# Patient Record
Sex: Male | Born: 1960
Health system: Southern US, Community
[De-identification: ages and names within clinical notes are randomized; demographics above are authoritative.]

## PROBLEM LIST (undated history)

## (undated) DIAGNOSIS — E119 Type 2 diabetes mellitus without complications: Secondary | ICD-10-CM

## (undated) DIAGNOSIS — T7840XA Allergy, unspecified, initial encounter: Secondary | ICD-10-CM

## (undated) HISTORY — DX: Allergy, unspecified, initial encounter: T78.40XA

---

## 2003-05-23 ENCOUNTER — Emergency Department (HOSPITAL_COMMUNITY): Admission: EM | Admit: 2003-05-23 | Discharge: 2003-05-23 | Payer: Self-pay | Admitting: Emergency Medicine

## 2007-01-11 ENCOUNTER — Emergency Department (HOSPITAL_COMMUNITY): Admission: EM | Admit: 2007-01-11 | Discharge: 2007-01-11 | Payer: Self-pay | Admitting: Emergency Medicine

## 2009-10-02 ENCOUNTER — Encounter: Admission: RE | Admit: 2009-10-02 | Discharge: 2009-10-02 | Payer: Self-pay | Admitting: General Practice

## 2010-12-07 ENCOUNTER — Emergency Department (HOSPITAL_COMMUNITY)
Admission: EM | Admit: 2010-12-07 | Discharge: 2010-12-07 | Payer: Self-pay | Source: Home / Self Care | Admitting: Emergency Medicine

## 2011-03-12 LAB — URINALYSIS, ROUTINE W REFLEX MICROSCOPIC
Bilirubin Urine: NEGATIVE
Nitrite: NEGATIVE
Specific Gravity, Urine: 1.01 (ref 1.005–1.030)
Urobilinogen, UA: 0.2 mg/dL (ref 0.0–1.0)
pH: 6 (ref 5.0–8.0)

## 2013-11-04 ENCOUNTER — Ambulatory Visit: Payer: Self-pay | Admitting: Physician Assistant

## 2013-11-04 VITALS — BP 126/82 | HR 90 | Temp 98.0°F | Resp 16 | Ht 64.0 in | Wt 168.8 lb

## 2013-11-04 DIAGNOSIS — Z0289 Encounter for other administrative examinations: Secondary | ICD-10-CM

## 2013-11-04 NOTE — Progress Notes (Signed)
   [address] [place] [address]   Phone [phone]   Subjective:    Patient ID: Anthony Walker, male    DOB: [birth year], 52 y.o.   MRN: [id number]  HPI Pt presents to clinic for DOT exam.  He has had them in the past and always gotten a 2 year card.  He is healthy and has no concerns.  Review of Systems  Constitutional: Negative.   HENT: Negative.   Eyes: Negative.   Respiratory: Negative.   Cardiovascular: Negative.   Gastrointestinal: Negative.   Endocrine: Negative.   Genitourinary: Negative.   Musculoskeletal: Negative.   Allergic/Immunologic: Negative.   Neurological: Negative.   Hematological: Negative.        Objective:   Physical Exam  Vitals reviewed. Constitutional: He is oriented to person, place, and time. He appears well-developed and well-nourished.  HENT:  Head: Normocephalic and atraumatic.  Right Ear: Hearing, tympanic membrane, external ear and ear canal normal.  Left Ear: Hearing, tympanic membrane, external ear and ear canal normal.  Nose: Nose normal.  Mouth/Throat: Uvula is midline, oropharynx is clear and moist and mucous membranes are normal.  Eyes: Conjunctivae and EOM are normal. Pupils are equal, round, and reactive to light.  Neck: Normal range of motion. Neck supple.  Cardiovascular: Normal rate, regular rhythm and normal heart sounds.   No murmur heard. Pulmonary/Chest: Effort normal and breath sounds normal.  Abdominal: Soft. Bowel sounds are normal. There is no tenderness. Hernia confirmed negative in the right inguinal area and confirmed negative in the left inguinal area.  Genitourinary: Circumcised.  Musculoskeletal: Normal range of motion.  Lymphadenopathy:    He has no cervical adenopathy.  Neurological: He is alert and oriented to person, place, and time. He has normal reflexes.  Skin: Skin is warm and dry.  Psychiatric: He has a normal mood and affect. His behavior is normal. Judgment and thought content normal.      Assessment & Plan:  Health examination of defined subpopulation  2 year DOT card.  Benny Lennert PA-C 11/04/2013 5:48 PM

## 2015-11-05 ENCOUNTER — Ambulatory Visit (INDEPENDENT_AMBULATORY_CARE_PROVIDER_SITE_OTHER): Payer: Self-pay | Admitting: Family Medicine

## 2015-11-05 VITALS — BP 120/80 | HR 79 | Temp 97.8°F | Resp 18 | Ht 64.0 in | Wt 166.0 lb

## 2015-11-05 DIAGNOSIS — Z0000 Encounter for general adult medical examination without abnormal findings: Secondary | ICD-10-CM

## 2015-11-05 DIAGNOSIS — Z021 Encounter for pre-employment examination: Secondary | ICD-10-CM

## 2015-11-05 NOTE — Progress Notes (Signed)
 @  This chart was scribed for Anthony Sidle, MD by Andrew Au, ED Scribe. This patient was seen in room 3 and the patient's care was started at 3:10 PM.  Patient ID: Anthony Walker MRN: [id number], DOB: 1961/05/30 54 y.o. Date of Encounter: 11/05/2015, 3:15 PM  Primary Physician: No primary care provider on file.  Chief Complaint: DOT Physical  HPI: 54 y.o. y/o male with history noted below here for DOT physical. 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   Doing well. Does not take medications.  No issues/complaints.  Review of Systems: Consitutional: No fever, chills, fatigue, night sweats, lymphadenopathy, or weight changes. Eyes: No visual changes, eye redness, or discharge. ENT/Mouth: Ears: No otalgia, tinnitus, hearing loss, discharge. Nose: No congestion, rhinorrhea, sinus pain, or epistaxis. Throat: No sore throat, post nasal drip, or teeth pain. Cardiovascular: No CP, palpitations, diaphoresis, DOE, edema, orthopnea, PND. Respiratory: No cough, hemoptysis, SOB, or wheezing. Gastrointestinal: No anorexia, dysphagia, reflux, pain, nausea, vomiting, hematemesis, diarrhea, constipation, BRBPR, or melena. Genitourinary: No dysuria, frequency, urgency, hematuria, incontinence, nocturia, decreased urinary stream, discharge, impotence, or testicular pain/masses. Musculoskeletal: No decreased ROM, myalgias, stiffness, joint swelling, or weakness. Skin: No rash, erythema, lesion changes, pain, warmth, jaundice, or pruritis. Neurological: No headache, dizziness, syncope, seizures, tremors, memory loss, coordination problems, or paresthesias. Psychological: No anxiety, depression, hallucinations, SI/HI. Endocrine: No fatigue, polydipsia, polyphagia, polyuria, or known diabetes. All other systems were reviewed and are otherwise negative.  Past Medical History  Diagnosis Date  . Seasonal      No past surgical history on file.  Home Meds:  Prior to  Admission medications   Medication Sig Start Date End Date Taking? Authorizing Provider  cetirizine (ZYRTEC) 10 MG tablet Take 10 mg by mouth daily.    Historical Provider, MD    Allergies: No Known Allergies  Social History   Social History  . Marital Status: Single    Spouse Name: N/A  . Number of Children: N/A  . Years of Education: N/A   Occupational History  . Not on file.   Social History Main Topics  . Smoking status: Never Smoker   . Smokeless tobacco: Not on file  . Alcohol Use: No  . Drug Use: No  . Sexual Activity: Not on file   Other Topics Concern  . Not on file   Social History Narrative    Family History  Problem Relation Age of Onset  . Hypertension Mother   . Diabetes Father   . Parkinson's disease Father     Physical Exam: Blood pressure 120/80, pulse 79, temperature 97.8 F (36.6 C), temperature source Oral, resp. rate 18, height  (1.626 m), weight 166 lb (75.297 kg), SpO2 99 %.  BP Readings from Last 3 Encounters:  11/05/15 120/80  11/04/13 126/82   General: Well developed, well nourished, in no acute distress. HEENT: Normocephalic, atraumatic. Conjunctiva pink, sclera non-icteric. Pupils 2 mm constricting to 1 mm, round, regular, and equally reactive to light and accomodation. EOMI.  Fundi benign   Internal auditory canal clear. TMs with good cone of light and without pathology. Nasal mucosa pink. Nares are without discharge. No sinus tenderness. Oral mucosa pink. Dentition. Pharynx without exudate.    Neck: Supple. Trachea midline. No thyromegaly. Full ROM. No lymphadenopathy. Lungs: Clear to auscultation bilaterally without wheezes, rales, or rhonchi. Breathing is of normal effort and unlabored. Cardiovascular: RRR with S1 S2. No murmurs, rubs, or gallops appreciated. Distal pulses 2+ symmetrically. No  carotid or abdominal bruits Abdomen: Soft, non-tender, non-distended with normoactive bowel sounds. No hepatosplenomegaly or masses. No  rebound/guarding. No CVA tenderness. Without hernias.  Rectal: No external hemorrhoids or fissures. Rectal vault without masses.  Genitourinary:  circumcised male. No penile lesions. Testes descended bilaterally, and smooth without tenderness or masses. No hernia Musculoskeletal: Full range of motion and 5/5 strength throughout. Without swelling, atrophy, tenderness, crepitus, or warmth. Extremities without clubbing, cyanosis, or edema. Calves supple. Skin: Warm and moist without erythema, ecchymosis, wounds, or rash. Neuro: A+Ox3. CN II-XII grossly intact. Moves all extremities spontaneously. Full sensation throughout. Normal gait. DTR 2+ throughout upper and lower extremities. Finger to nose intact. Psych:  Responds to questions appropriately with a normal affect.   Assessment/Plan:  54 y.o. y/o healthy male here for CPE   ICD-9-CM ICD-10-CM   1. Annual physical exam V70.0 Z00.00       By signing my name below, I, [name], attest that this documentation has been prepared under the direction and in the presence of Anthony SidleKurt Babita Amaker, MD.  Electronically Signed: Andrew Auaven Small, ED Scribe. 11/05/2015. 3:15 PM.   Signed, Anthony SidleKurt Anguel Delapena, MD 11/05/2015 3:15 PM

## 2017-10-20 ENCOUNTER — Ambulatory Visit: Payer: Self-pay | Admitting: Family Medicine

## 2017-10-21 ENCOUNTER — Encounter: Payer: Self-pay | Admitting: Family Medicine

## 2017-10-21 ENCOUNTER — Ambulatory Visit (INDEPENDENT_AMBULATORY_CARE_PROVIDER_SITE_OTHER): Payer: Self-pay | Admitting: Family Medicine

## 2017-10-21 VITALS — BP 102/68 | HR 83 | Temp 98.0°F | Resp 16 | Ht 64.96 in | Wt 170.0 lb

## 2017-10-21 DIAGNOSIS — Z024 Encounter for examination for driving license: Secondary | ICD-10-CM

## 2017-10-21 NOTE — Patient Instructions (Addendum)
I do not see any concerns on your physical today. 2 year card was provided.    IF you received an x-ray today, you will receive an invoice from [facility]. Please contact [facility] at [phone] with questions or concerns regarding your invoice.   IF you received labwork today, you will receive an invoice from [facility]. Please contact [facility] at [phone] with questions or concerns regarding your invoice.   Our billing staff will not be able to assist you with questions regarding bills from these companies.  You will be contacted with the lab results as soon as they are available. The fastest way to get your results is to activate your My Chart account. Instructions are located on the last page of this paperwork. If you have not heard from us regarding the results in 2 weeks, please contact this office.

## 2017-10-21 NOTE — Progress Notes (Signed)
Subjective:  By signing my name below, I, Ivar Bury, attest that this documentation has been prepared under the direction and in the presence of Dr. Meredith Staggers, MD. Electronically Signed: Ivar Bury, Medical Scribe 10/21/2017 at 4:08 PM.   Patient ID: Anthony Walker, male    DOB: 1961-12-22, 56 y.o.   MRN: [id number]  Chief Complaint  Patient presents with  . DOT Phy    HPI Anthony Walker is a 56 y.o. male who presents to [facility] Care at [facility] for DOT physical. 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 No medications or concerns at that time. He was provided a 2-year card. Today he reports no changes in medical or surgical history. He is not taking any prescribed medications. He reports occasional snoring but has never been told he has sleep apnea.   There are no active problems to display for this patient.  Past Medical History:  Diagnosis Date  . Seasonal    History reviewed. No pertinent surgical history. No Known Allergies Prior to Admission medications   Medication Sig Start Date End Date Taking? Authorizing Provider  cetirizine (ZYRTEC) 10 MG tablet Take 10 mg by mouth daily.   Yes [provider]   Social History   Social History  . Marital status: Single    Spouse name: N/A  . Number of children: N/A  . Years of education: N/A   Occupational History  . Not on file.   Social History Main Topics  . Smoking status: Never Smoker  . Smokeless tobacco: Never Used  . Alcohol use No  . Drug use: No  . Sexual activity: Not on file   Other Topics Concern  . Not on file   Social History Narrative  . No narrative on file    Review of Systems Negative other than above and reviewed driver form. No positive responses on driver history form.     Objective:   Physical Exam  Constitutional: He is oriented to person, place, and time. He appears well-developed and well-nourished.  HENT:  Head: Normocephalic and atraumatic.    Right Ear: External ear normal.  Left Ear: External ear normal.  Mouth/Throat: Oropharynx is clear and moist.  Eyes: Pupils are equal, round, and reactive to light. Conjunctivae and EOM are normal.  Neck: Normal range of motion. Neck supple. No thyromegaly present.  Cardiovascular: Normal rate, regular rhythm, normal heart sounds and intact distal pulses.   Pulmonary/Chest: Effort normal and breath sounds normal. No respiratory distress. He has no wheezes.  Abdominal: Soft. He exhibits no distension. There is no tenderness.  Musculoskeletal: Normal range of motion. He exhibits no edema or tenderness.  Lymphadenopathy:    He has no cervical adenopathy.  Neurological: He is alert and oriented to person, place, and time. He has normal reflexes.  Skin: Skin is warm and dry.  Psychiatric: He has a normal mood and affect. His behavior is normal.  Vitals reviewed.   Vitals:   10/21/17 1522  BP: 102/68  Pulse: 83  Resp: 16  Temp: 98 F (36.7 C)  TempSrc: Oral  SpO2: 98%  Weight: 170 lb (77.1 kg)  Height: 5' 4.96" (1.65 m)    Visual Acuity Screening   Right eye Left eye Both eyes  Without correction: 20/20 20/20 20/20   With correction:     Comments: Horizontal:   Left-85 Right-70   Hearing Screening Comments: Whisper Test:  Left- 25ft Right-82ft  Results for orders placed or performed during the  hospital encounter of 12/07/10  Urinalysis, Routine w reflex microscopic  Result Value Ref Range   Color, Urine YELLOW YELLOW   APPearance CLOUDY (A) CLEAR   Specific Gravity, Urine 1.010 1.005 - 1.030   pH 6.0 5.0 - 8.0   Glucose, UA NEGATIVE NEGATIVE mg/dL   Hgb urine dipstick NEGATIVE NEGATIVE   Bilirubin Urine NEGATIVE NEGATIVE   Ketones, ur NEGATIVE NEGATIVE mg/dL   Protein, ur NEGATIVE NEGATIVE mg/dL   Urobilinogen, UA 0.2 0.0 - 1.0 mg/dL   Nitrite NEGATIVE NEGATIVE   Leukocytes, UA  NEGATIVE    NEGATIVE MICROSCOPIC NOT DONE ON URINES WITH NEGATIVE PROTEIN, BLOOD,  LEUKOCYTES, NITRITE, OR GLUCOSE <1000 mg/dL.      Assessment & Plan:  Anthony FailHayatuddeen A Walker is a 56 y.o. male Encounter for commercial driver medical examination (CDME)  - DOT physical, no concerning findings on exam or history. 2 year card provided, see paperwork.  No orders of the defined types were placed in this encounter.  Patient Instructions    I do not see any concerns on your physical today. 2 year card was provided.    IF you received an x-ray today, you will receive an invoice from [facility]. Please contact [facility] at [phone] with questions or concerns regarding your invoice.   IF you received labwork today, you will receive an invoice from [facility]. Please contact [facility] at [phone] with questions or concerns regarding your invoice.   Our billing staff will not be able to assist you with questions regarding bills from these companies.  You will be contacted with the lab results as soon as they are available. The fastest way to get your results is to activate your My Chart account. Instructions are located on the last page of this paperwork. If you have not heard from us regarding the results in 2 weeks, please contact this office.       I personally performed the services described in this documentation, which was scribed in my presence. The recorded information has been reviewed and considered for accuracy and completeness, addended by me as needed, and agree with information above.  Signed,   Meredith StaggersJeffrey Finnbar Cedillos, MD [facility] at [facility].  10/22/17 3:04 PM

## 2018-04-30 ENCOUNTER — Emergency Department (HOSPITAL_COMMUNITY): Payer: BLUE CROSS/BLUE SHIELD

## 2018-04-30 ENCOUNTER — Emergency Department (HOSPITAL_COMMUNITY)
Admission: EM | Admit: 2018-04-30 | Discharge: 2018-05-01 | Disposition: A | Discharge status: Home / Self Care | Payer: BLUE CROSS/BLUE SHIELD | Attending: Emergency Medicine | Admitting: Emergency Medicine

## 2018-04-30 ENCOUNTER — Other Ambulatory Visit: Payer: Self-pay

## 2018-04-30 ENCOUNTER — Encounter (HOSPITAL_COMMUNITY): Payer: Self-pay | Admitting: Emergency Medicine

## 2018-04-30 DIAGNOSIS — Y999 Unspecified external cause status: Secondary | ICD-10-CM | POA: Insufficient documentation

## 2018-04-30 DIAGNOSIS — Y929 Unspecified place or not applicable: Secondary | ICD-10-CM | POA: Insufficient documentation

## 2018-04-30 DIAGNOSIS — S61216A Laceration without foreign body of right little finger without damage to nail, initial encounter: Secondary | ICD-10-CM | POA: Diagnosis present

## 2018-04-30 DIAGNOSIS — Z79899 Other long term (current) drug therapy: Secondary | ICD-10-CM | POA: Insufficient documentation

## 2018-04-30 DIAGNOSIS — W268XXA Contact with other sharp object(s), not elsewhere classified, initial encounter: Secondary | ICD-10-CM | POA: Diagnosis not present

## 2018-04-30 DIAGNOSIS — Y9366 Activity, soccer: Secondary | ICD-10-CM | POA: Insufficient documentation

## 2018-04-30 MED ORDER — LIDOCAINE HCL (PF) 1 % IJ SOLN
10.0000 mL | Freq: Once | INTRAMUSCULAR | Status: AC
  Administered 2018-04-30: 10 mL
  Filled 2018-04-30: qty 10

## 2018-04-30 MED ORDER — BUPIVACAINE HCL 0.25 % IJ SOLN
5.0000 mL | Freq: Once | INTRAMUSCULAR | Status: DC
  Filled 2018-04-30: qty 5

## 2018-04-30 MED ORDER — TETANUS-DIPHTH-ACELL PERTUSSIS 5-2.5-18.5 LF-MCG/0.5 IM SUSP
0.5000 mL | Freq: Once | INTRAMUSCULAR | Status: AC
  Administered 2018-04-30: 0.5 mL via INTRAMUSCULAR
  Filled 2018-04-30: qty 0.5

## 2018-04-30 NOTE — ED Triage Notes (Signed)
Pt presents with lac to R pinky approx 1in, states he cut finger on metal fence while playing soccer. Bleeding controlled. Tetanus not UTD

## 2018-05-01 MED ORDER — LIDOCAINE HCL (PF) 1 % IJ SOLN
5.0000 mL | Freq: Once | INTRAMUSCULAR | Status: AC
  Filled 2018-05-01: qty 5

## 2018-05-01 MED ORDER — IBUPROFEN 600 MG PO TABS: 600.0000 mg | ORAL_TABLET | Freq: Four times a day (QID) | ORAL | 0 refills | Status: DC | PRN

## 2018-05-01 MED ORDER — ACETAMINOPHEN 325 MG PO TABS: 650.0000 mg | ORAL_TABLET | Freq: Four times a day (QID) | ORAL | 0 refills | Status: DC | PRN

## 2018-05-01 MED ORDER — CEPHALEXIN 250 MG PO CAPS
500.0000 mg | ORAL_CAPSULE | Freq: Once | ORAL | Status: AC
  Administered 2018-05-01: 500 mg via ORAL
  Filled 2018-05-01: qty 2

## 2018-05-01 MED ORDER — CEPHALEXIN 500 MG PO CAPS: 500.0000 mg | ORAL_CAPSULE | Freq: Four times a day (QID) | ORAL | 0 refills | Status: DC

## 2018-05-01 NOTE — ED Notes (Signed)
ED Provider at bedside. 

## 2018-05-01 NOTE — Discharge Instructions (Signed)
Please see the information and instructions below regarding your visit.  Your diagnoses today include:  1. Laceration of right little finger without foreign body without damage to nail, initial encounter     Tests performed today include: X-ray of the affected area that did not show any foreign bodies or broken bones Vital signs. See below for your results today.   Medications prescribed:   Please take all of your antibiotics until finished.   You may develop abdominal discomfort or nausea from the antibiotic. If this occurs, you may take it with food. Some patients also get diarrhea with antibiotics. You may help offset this with probiotics which you can buy or get in yogurt. Do not eat or take the probiotics until 2 hours after your antibiotic. Some women develop vaginal yeast infections after antibiotics. If you develop unusual vaginal discharge after being on this medication, please see your primary care provider.   Some people develop allergies to antibiotics. Symptoms of antibiotic allergy can be mild and include a flat rash and itching. They can also be more serious and include:  ?Hives - Hives are raised, red patches of skin that are usually very itchy.  ?Lip or tongue swelling  ?Trouble swallowing or breathing  ?Blistering of the skin or mouth.  If you have any of these serious symptoms, please seek emergency medical care immediately.  Take any prescribed medications only as directed.  Ibuprofen alternating with Tylenol for pain.   You are prescribed ibuprofen, a non-steroidal anti-inflammatory agent (NSAID) for pain. You may take  every 6 hours as needed for pain. If still requiring this medication around the clock for acute pain after 10 days, please see your primary healthcare provider.  Women who are pregnant, breastfeeding, or planning on becoming pregnant should not take non-steroidal anti-inflammatories such as   You may combine this medication with Tylenol, 650  mg every 6 hours, so you are receiving something for pain every 3 hours.  This is not a long-term medication unless under the care and direction of your primary provider. Taking this medication long-term and not under the supervision of a healthcare provider could increase the risk of stomach ulcers, kidney problems, and cardiovascular problems such as high blood pressure.   Home care instructions:  Follow any educational materials and wound care instructions contained in this packet.   Keep affected area above the level of your heart when possible to minimize swelling. Wash area gently twice a day with warm soapy water. Do not apply alcohol or hydrogen peroxide directly over a wound. Cover the area if it is draining or weeping. Keep the bandage in place for 24 hours and refrain from getting the wound wet for 24 hours. After that, you may get the area wet, but please ensure that you dry it completely afterwards.  Please refrain from soaking sutures for long periods of time, or swimming in chlorinated water   Follow-up instructions: Suture Removal: Return to the Emergency Department or see your primary care care doctor in 10 days for a recheck of your wound and removal of your sutures or staples.    Return instructions:  Return to the Emergency Department if you have: Fever Worsening pain Worsening swelling of the wound Pus draining from the wound Redness of the skin that moves away from the wound, especially if it streaks away from the affected area  Any other emergent concerns  Your vital signs today were: BP 123/82 (BP Location: Right Arm)    Pulse 89  Temp 98.4 F (36.9 C) (Oral)    Resp 18    Ht  (1.626 m)    Wt 77.1 kg (170 lb)    SpO2 100%    BMI 29.18 kg/m  If your blood pressure (BP) was elevated on multiple readings during this visit above 130 for the top number or above 80 for the bottom number, please have this repeated by your primary care provider within one  month. --------------  Thank you for allowing [place] to participate in your care today! It was a pleasure taking care of you.

## 2018-05-01 NOTE — ED Notes (Signed)
Pt departed in NAD, refused use of wheelchair.  

## 2018-05-01 NOTE — ED Provider Notes (Addendum)
[facility] EMERGENCY DEPARTMENT Provider Note   CSN: [id number] Arrival date & time: 04/30/18  2106     History   Chief Complaint Chief Complaint  Patient presents with  . Laceration    HPI Anthony Walker is a 57 y.o. male.  HPI  Patient is a 57 year old male with a history of seasonal allergies presenting for laceration to the right fifth digit on the flexor surface.  Patient reports he was playing soccer earlier this evening, when the ball florescence, when he reached over to grab it, he caught his hand on the edge of the fence and sustained laceration.  Patient reports that bleeding was controlled prior to arrival with compression.  Patient denies any weakness or numbness, or inability to flex or extend the fifth finger of right hand.  Patient reports that he does not know when his last tetanus shot was.  Past Medical History:  Diagnosis Date  . Seasonal     There are no active problems to display for this patient.   History reviewed. No pertinent surgical history.      Home Medications    Prior to Admission medications   Medication Sig Start Date End Date Taking? Authorizing Provider  cetirizine (ZYRTEC) 10 MG tablet Take 10 mg by mouth daily.    [provider]    Family History Family History  Problem Relation Age of Onset  . Hypertension Mother   . Diabetes Father   . Parkinson's disease Father     Social History Social History   Tobacco Use  . Smoking status: Never Smoker  . Smokeless tobacco: Never Used  Substance Use Topics  . Alcohol use: No  . Drug use: No     Allergies   Patient has no known allergies.   Review of Systems Review of Systems  Skin: Positive for wound.  Neurological: Negative for weakness and numbness.     Physical Exam Updated Vital Signs BP 123/82 (BP Location: Right Arm)   Pulse 89   Temp 98.4 F (36.9 C) (Oral)   Resp 18   Ht  (1.626 m)   Wt 77.1 kg (170 lb)   SpO2  100%   BMI 29.18 kg/m   Physical Exam  Constitutional: He appears well-developed and well-nourished. No distress.  Sitting comfortably in bed.  HENT:  Head: Normocephalic and atraumatic.  Eyes: Conjunctivae are normal. Right eye exhibits no discharge. Left eye exhibits no discharge.  EOMs normal to gross examination.  Neck: Normal range of motion.  Cardiovascular: Normal rate and regular rhythm.  Intact, 2+ radial and ulnar pulse of RUE.  Pulmonary/Chest:  Normal respiratory effort. Patient converses comfortably. No audible wheeze or stridor.  Abdominal: He exhibits no distension.  Musculoskeletal: Normal range of motion.  Hand Exam:  Inspection: There is approximately 1.5 cm diagonal laceration extending across the flexor surface of the right fifth digit crossing over the PIP joint.   Palpation: Wound explored once anesthetized, and demonstrates no evidence of flexor tendon exposure or injury. ROM: Passive/active ROM intact at wrist, MCP, PIP, and DIP joints, thumb MCP and IP joints, and no rotational deformity of metacarpals noted. Ligamentous stability: No laxity to valgus/varus stress of MCP, PIP, or DIP joints. No joint laxity with radial stress of thumb. Flexor/Extensor tendons: FDS/FDP tendons intact in digits 2-5 at PIP/DIP joints, respectively; extensor tendons intact in all digits Nerve testing:  Sensation is intact to sharp touch in entire digital nerve distribution of the right fifth  digit.  Vascular: 2+ radial and ulnar pulses. Capillary refill <2 seconds b/l.  Neurological: He is alert.  Cranial nerves intact to gross observation. Patient moves extremities without difficulty.  Skin: Skin is warm and dry. He is not diaphoretic.  Psychiatric: He has a normal mood and affect. His behavior is normal. Judgment and thought content normal.  Nursing note and vitals reviewed.      ED Treatments / Results  Labs (all labs ordered are listed, but only abnormal results are  displayed) Labs Reviewed - No data to display  EKG None  Radiology Dg Finger Little Right  Result Date: 04/30/2018 CLINICAL DATA:  Laceration EXAM: RIGHT LITTLE FINGER 2+V COMPARISON:  None. FINDINGS: No fracture or malalignment. No radiopaque foreign body. Soft tissue injury volar aspect of the mid fifth digit. IMPRESSION: No acute osseous abnormality Electronically Signed   By: Jasmine Pang M.D.   On: 04/30/2018 21:47    Procedures .Marland KitchenLaceration Repair Date/Time: 05/01/2018 2:51 AM Performed by: Elisha Ponder, PA-C Authorized by: Elisha Ponder, PA-C   Consent:    Consent obtained:  Verbal   Consent given by:  Patient   Risks discussed:  Infection, pain and poor wound healing Anesthesia (see MAR for exact dosages):    Anesthesia method:  Nerve block   Block needle gauge:  25 G   Block anesthetic:  Lidocaine 1% w/o epi   Block technique:  Digital block   Block outcome:  Anesthesia achieved Laceration details:    Location:  Finger   Finger location:  R small finger   Length (cm):  1.5 Repair type:    Repair type:  Simple Pre-procedure details:    Preparation:  Patient was prepped and draped in usual sterile fashion and imaging obtained to evaluate for foreign bodies Exploration:    Hemostasis achieved with:  Direct pressure   Wound exploration: wound explored through full range of motion     Wound extent: no nerve damage noted, no tendon damage noted, no underlying fracture noted and no vascular damage noted     Contaminated: no   Treatment:    Area cleansed with:  Betadine   Amount of cleaning:  Standard   Irrigation solution:  Sterile saline (1000 ml)   Irrigation method:  Syringe and pressure wash   Visualized foreign bodies/material removed: yes   Skin repair:    Repair method:  Sutures   Suture size:  4-0   Suture material:  Nylon   Suture technique:  Simple interrupted   Number of sutures:  3 Approximation:    Approximation:  Close Post-procedure  details:    Dressing:  Non-adherent dressing, splint for protection and tube gauze   Patient tolerance of procedure:  Tolerated well, no immediate complications   (including critical care time)  Medications Ordered in ED Medications  bupivacaine (MARCAINE) 0.25 % (with pres) injection 5 mL (has no administration in time range)  Tdap (BOOSTRIX) injection 0.5 mL (0.5 mLs Intramuscular Given 04/30/18 2324)  lidocaine (PF) (XYLOCAINE) 1 % injection 10 mL (10 mLs Infiltration Given 04/30/18 2347)  lidocaine (PF) (XYLOCAINE) 1 % injection 5 mL (5 mLs Infiltration Given 05/01/18 0033)     Initial Impression / Assessment and Plan / ED Course  I have reviewed the triage vital signs and the nursing notes.  Pertinent labs & imaging results that were available during my care of the patient were reviewed by me and considered in my medical decision making (see chart for details).  Patient is well-appearing and neurovascularly intact in the right hand.  There is no evidence of nervous injury or tendon exposure or injury.  Patient is able to perform all motions of flexion and extension of the right fifth digit without difficulty.  The wound was extensively irrigated with 1000 mL of normal saline.  Loose primary closure successfully performed.  Tdap updated today.  Patient also given Keflex prophylaxis for infection.  Patient was given return precautions for any increasing swelling, redness from the site, purulent drainage, pallor or paresthesias of the finger, or delayed capillary refill.  Patient is in understanding and agrees with the plan of care.  Laceration image show to Dr. Maurine Simmering, who is in agreement with plan of care.  Final Clinical Impressions(s) / ED Diagnoses   Final diagnoses:  Laceration of right little finger without foreign body without damage to nail, initial encounter    ED Discharge Orders        Ordered    cephALEXin (KEFLEX) 500 MG capsule  4 times daily     05/01/18  0156    ibuprofen (ADVIL,MOTRIN) 600 MG tablet  Every 6 hours PRN     05/01/18 0157    acetaminophen (TYLENOL) 325 MG tablet  Every 6 hours PRN     05/01/18 0157       Elisha Ponder, PA-C 05/01/18 0254    Aviva Kluver B, PA-C 05/01/18 0256    Zadie Rhine, MD 05/02/18 [phone]

## 2018-05-12 ENCOUNTER — Encounter (HOSPITAL_COMMUNITY): Payer: Self-pay | Admitting: Emergency Medicine

## 2018-05-12 ENCOUNTER — Emergency Department (HOSPITAL_COMMUNITY)
Admission: EM | Admit: 2018-05-12 | Discharge: 2018-05-12 | Disposition: A | Discharge status: Home / Self Care | Payer: BLUE CROSS/BLUE SHIELD | Attending: Emergency Medicine | Admitting: Emergency Medicine

## 2018-05-12 ENCOUNTER — Other Ambulatory Visit: Payer: Self-pay

## 2018-05-12 DIAGNOSIS — Z4802 Encounter for removal of sutures: Secondary | ICD-10-CM

## 2018-05-12 DIAGNOSIS — Z79899 Other long term (current) drug therapy: Secondary | ICD-10-CM | POA: Diagnosis not present

## 2018-05-12 DIAGNOSIS — S6992XD Unspecified injury of left wrist, hand and finger(s), subsequent encounter: Secondary | ICD-10-CM | POA: Diagnosis present

## 2018-05-12 DIAGNOSIS — S61217D Laceration without foreign body of left little finger without damage to nail, subsequent encounter: Secondary | ICD-10-CM | POA: Insufficient documentation

## 2018-05-12 DIAGNOSIS — W228XXD Striking against or struck by other objects, subsequent encounter: Secondary | ICD-10-CM | POA: Insufficient documentation

## 2018-05-12 NOTE — ED Triage Notes (Signed)
Patient here for suture removal in right hand. Sutures placed 12 days ago. No redness, warmth, or drainage from wound.

## 2018-05-12 NOTE — ED Provider Notes (Signed)
[facility] EMERGENCY DEPARTMENT Provider Note   CSN: [id number] Arrival date & time: 05/12/18  1118     History   Chief Complaint Chief Complaint  Patient presents with  . Suture / Staple Removal    HPI Anthony Walker is a 57 y.o. male.  HPI   57 year old male presents today with laceration to his left fifth digit.  Patient suffered a laceration proximally 12 days ago was treated and discharged home.  He notes good wound healing with no signs of infection no complications.  Past Medical History:  Diagnosis Date  . Seasonal     There are no active problems to display for this patient.   History reviewed. No pertinent surgical history.      Home Medications    Prior to Admission medications   Medication Sig Start Date End Date Taking? Authorizing Provider  acetaminophen (TYLENOL) 325 MG tablet Take 2 tablets (650 mg total) by mouth every 6 (six) hours as needed. 05/01/18   Aviva Kluver B, PA-C  cephALEXin (KEFLEX) 500 MG capsule Take 1 capsule (500 mg total) by mouth 4 (four) times daily. 05/01/18   Aviva Kluver B, PA-C  cetirizine (ZYRTEC) 10 MG tablet Take 10 mg by mouth daily.    [provider]  ibuprofen (ADVIL,MOTRIN) 600 MG tablet Take 1 tablet (600 mg total) by mouth every 6 (six) hours as needed. 05/01/18   Elisha Ponder, PA-C    Family History Family History  Problem Relation Age of Onset  . Hypertension Mother   . Diabetes Father   . Parkinson's disease Father     Social History Social History   Tobacco Use  . Smoking status: Never Smoker  . Smokeless tobacco: Never Used  Substance Use Topics  . Alcohol use: No  . Drug use: No     Allergies   Patient has no known allergies.   Review of Systems Review of Systems  All other systems reviewed and are negative.    Physical Exam Updated Vital Signs BP 115/81 (BP Location: Right Arm)   Pulse 70   Temp 98.2 F (36.8 C) (Oral)   Resp 20   SpO2 100%    Physical Exam  Constitutional: He is oriented to person, place, and time. He appears well-developed and well-nourished.  HENT:  Head: Normocephalic and atraumatic.  Eyes: Pupils are equal, round, and reactive to light. Conjunctivae are normal. Right eye exhibits no discharge. Left eye exhibits no discharge. No scleral icterus.  Neck: Normal range of motion. No JVD present. No tracheal deviation present.  Pulmonary/Chest: Effort normal. No stridor.  Musculoskeletal:  1.5 cm well approximated wound to the left fifth digit along the palmar aspect, no surrounding redness, tenderness swelling or discharge  Neurological: He is alert and oriented to person, place, and time. Coordination normal.  Psychiatric: He has a normal mood and affect. His behavior is normal. Judgment and thought content normal.  Nursing note and vitals reviewed.    ED Treatments / Results  Labs (all labs ordered are listed, but only abnormal results are displayed) Labs Reviewed - No data to display  EKG None  Radiology No results found.  Procedures .Suture Removal Date/Time: 05/12/2018 2:22 PM Performed by: Eyvonne Mechanic, PA-C Authorized by: Eyvonne Mechanic, PA-C   Consent:    Consent obtained:  Verbal   Consent given by:  Patient Location:    Location: Left fifth finger. Procedure details:    Wound appearance:  No signs of infection,  good wound healing and clean   Number of sutures removed:  3 Post-procedure details:    Post-removal:  No dressing applied   Patient tolerance of procedure:  Tolerated well, no immediate complications   (including critical care time)  Medications Ordered in ED Medications - No data to display   Initial Impression / Assessment and Plan / ED Course  I have reviewed the triage vital signs and the nursing notes.  Pertinent labs & imaging results that were available during my care of the patient were reviewed by me and considered in my medical decision making (see  chart for details).     Suture removal no signs of infection return precautions given. Final Clinical Impressions(s) / ED Diagnoses   Final diagnoses:  Visit for suture removal    ED Discharge Orders    None       Rosalio Loud 05/12/18 1423    Rolland Porter, MD 05/18/18 [phone]

## 2018-05-12 NOTE — Discharge Instructions (Addendum)
Please read attached information. If you experience any new or worsening signs or symptoms please return to the emergency room for evaluation. Please follow-up with your primary care provider or specialist as discussed.  °

## 2018-10-06 ENCOUNTER — Encounter (HOSPITAL_COMMUNITY): Payer: Self-pay

## 2018-10-06 ENCOUNTER — Emergency Department (HOSPITAL_COMMUNITY)
Admission: EM | Admit: 2018-10-06 | Discharge: 2018-10-06 | Disposition: A | Discharge status: Home / Self Care | Payer: BLUE CROSS/BLUE SHIELD | Attending: Emergency Medicine | Admitting: Emergency Medicine

## 2018-10-06 ENCOUNTER — Other Ambulatory Visit: Payer: Self-pay

## 2018-10-06 DIAGNOSIS — E162 Hypoglycemia, unspecified: Secondary | ICD-10-CM | POA: Insufficient documentation

## 2018-10-06 DIAGNOSIS — R42 Dizziness and giddiness: Secondary | ICD-10-CM | POA: Diagnosis present

## 2018-10-06 DIAGNOSIS — Z7984 Long term (current) use of oral hypoglycemic drugs: Secondary | ICD-10-CM | POA: Diagnosis not present

## 2018-10-06 DIAGNOSIS — E119 Type 2 diabetes mellitus without complications: Secondary | ICD-10-CM | POA: Diagnosis not present

## 2018-10-06 HISTORY — DX: Type 2 diabetes mellitus without complications: E11.9

## 2018-10-06 LAB — COMPREHENSIVE METABOLIC PANEL
ALBUMIN: 4.6 g/dL (ref 3.5–5.0)
ALT: 17 U/L (ref 0–44)
ANION GAP: 12 (ref 5–15)
AST: 21 U/L (ref 15–41)
Alkaline Phosphatase: 108 U/L (ref 38–126)
BUN: 13 mg/dL (ref 6–20)
CHLORIDE: 104 mmol/L (ref 98–111)
CO2: 25 mmol/L (ref 22–32)
Calcium: 9.8 mg/dL (ref 8.9–10.3)
Creatinine, Ser: 1.11 mg/dL (ref 0.61–1.24)
GFR calc Af Amer: >60 mL/min (ref >60)
GFR calc non Af Amer: >60 mL/min (ref >60)
GLUCOSE: 121 mg/dL — AB (ref 70–99)
Potassium: 4.3 mmol/L (ref 3.5–5.1)
SODIUM: 141 mmol/L (ref 135–145)
TOTAL PROTEIN: 8.4 g/dL — AB (ref 6.5–8.1)
Total Bilirubin: 0.7 mg/dL (ref 0.3–1.2)

## 2018-10-06 LAB — CBC WITH DIFFERENTIAL/PLATELET
Abs Immature Granulocytes: 0.01 10*3/uL (ref 0.00–0.07)
Basophils Absolute: 0 10*3/uL (ref 0.0–0.1)
Basophils Relative: 1 %
EOS ABS: 0.1 10*3/uL (ref 0.0–0.5)
EOS PCT: 3 %
HCT: 41.6 % (ref 39.0–52.0)
Hemoglobin: 14 g/dL (ref 13.0–17.0)
IMMATURE GRANULOCYTES: 0 %
LYMPHS PCT: 42 %
Lymphs Abs: 1.9 10*3/uL (ref 0.7–4.0)
MCH: 30.3 pg (ref 26.0–34.0)
MCHC: 33.7 g/dL (ref 30.0–36.0)
MCV: 90 fL (ref 80.0–100.0)
Monocytes Absolute: 0.6 10*3/uL (ref 0.1–1.0)
Monocytes Relative: 12 %
NEUTROS PCT: 42 %
NRBC: 0 % (ref 0.0–0.2)
Neutro Abs: 1.9 10*3/uL (ref 1.7–7.7)
Platelets: 263 10*3/uL (ref 150–400)
RBC: 4.62 MIL/uL (ref 4.22–5.81)
RDW: 12.8 % (ref 11.5–15.5)
WBC: 4.4 10*3/uL (ref 4.0–10.5)

## 2018-10-06 LAB — CBG MONITORING, ED: Glucose-Capillary: 94 mg/dL (ref 70–99)

## 2018-10-06 MED ORDER — ACETAMINOPHEN 325 MG PO TABS
650.0000 mg | ORAL_TABLET | Freq: Once | ORAL | Status: AC
  Administered 2018-10-06: 650 mg via ORAL
  Filled 2018-10-06: qty 2

## 2018-10-06 NOTE — ED Provider Notes (Signed)
[facility]-EMERGENCY DEPT Provider Note   CSN: [id number] Arrival date & time: 10/06/18  1749     History   Chief Complaint Chief Complaint  Patient presents with  . Hypoglycemia  . Dizziness  . Headache    HPI Anthony Walker is a 57 y.o. male.  Patient states that he had a headache and he felt weak.  He checked his sugar and it was low.  Patient has been taking Glucophage but has not been eating much  The history is provided by the patient. No language interpreter was used.  Illness  This is a new problem. The current episode started 3 to 5 hours ago. The problem occurs constantly. The problem has not changed since onset.Associated symptoms include headaches. Pertinent negatives include no chest pain and no abdominal pain. Nothing aggravates the symptoms. Nothing relieves the symptoms. He has tried nothing for the symptoms. The treatment provided no relief.    Past Medical History:  Diagnosis Date  . Diabetes mellitus without complication (HCC)   . Seasonal     There are no active problems to display for this patient.   History reviewed. No pertinent surgical history.      Home Medications    Prior to Admission medications   Medication Sig Start Date End Date Taking? Authorizing Provider  metFORMIN (GLUCOPHAGE) 500 MG tablet Take 500 mg by mouth daily with breakfast.   Yes [provider]  acetaminophen (TYLENOL) 325 MG tablet Take 2 tablets (650 mg total) by mouth every 6 (six) hours as needed. Patient not taking: Reported on 10/06/2018 05/01/18   Aviva Kluver B, PA-C  cephALEXin (KEFLEX) 500 MG capsule Take 1 capsule (500 mg total) by mouth 4 (four) times daily. Patient not taking: Reported on 10/06/2018 05/01/18   Aviva Kluver B, PA-C  ibuprofen (ADVIL,MOTRIN) 600 MG tablet Take 1 tablet (600 mg total) by mouth every 6 (six) hours as needed. Patient not taking: Reported on 10/06/2018 05/01/18   Elisha Ponder, PA-C    Family  History Family History  Problem Relation Age of Onset  . Hypertension Mother   . Diabetes Father   . Parkinson's disease Father     Social History Social History   Tobacco Use  . Smoking status: Never Smoker  . Smokeless tobacco: Never Used  Substance Use Topics  . Alcohol use: No  . Drug use: No     Allergies   Patient has no known allergies.   Review of Systems Review of Systems  Constitutional: Negative for appetite change and fatigue.  HENT: Negative for congestion, ear discharge and sinus pressure.   Eyes: Negative for discharge.  Respiratory: Negative for cough.   Cardiovascular: Negative for chest pain.  Gastrointestinal: Negative for abdominal pain and diarrhea.  Genitourinary: Negative for frequency and hematuria.  Musculoskeletal: Negative for back pain.  Skin: Negative for rash.  Neurological: Positive for headaches. Negative for seizures.  Psychiatric/Behavioral: Negative for hallucinations.     Physical Exam Updated Vital Signs BP 133/82   Pulse 71   Temp 97.9 F (36.6 C) (Oral)   Resp 16   Ht 5\' 5"  (1.651 m)   Wt 74.8 kg   SpO2 100%   BMI 27.46 kg/m   Physical Exam  Constitutional: He is oriented to person, place, and time. He appears well-developed.  HENT:  Head: Normocephalic.  Eyes: Conjunctivae and EOM are normal. No scleral icterus.  Neck: Neck supple. No thyromegaly present.  Cardiovascular: Normal rate and regular  rhythm. Exam reveals no gallop and no friction rub.  No murmur heard. Pulmonary/Chest: No stridor. He has no wheezes. He has no rales. He exhibits no tenderness.  Abdominal: He exhibits no distension. There is no tenderness. There is no rebound.  Musculoskeletal: Normal range of motion. He exhibits no edema.  Lymphadenopathy:    He has no cervical adenopathy.  Neurological: He is oriented to person, place, and time. He exhibits normal muscle tone. Coordination normal.  Skin: No rash noted. No erythema.  Psychiatric: He  has a normal mood and affect. His behavior is normal.     ED Treatments / Results  Labs (all labs ordered are listed, but only abnormal results are displayed) Labs Reviewed  COMPREHENSIVE METABOLIC PANEL - Abnormal; Notable for the following components:      Result Value   Glucose, Bld 121 (*)    Total Protein 8.4 (*)    All other components within normal limits  CBC WITH DIFFERENTIAL/PLATELET  CBG MONITORING, ED    EKG EKG Interpretation  Date/Time:  Tuesday October 06 2018 17:58:14 EDT Ventricular Rate:  75 PR Interval:    QRS Duration: 94 QT Interval:  370 QTC Calculation: 414 R Axis:   87 Text Interpretation:  Sinus rhythm Minimal ST elevation, inferior leads Confirmed by Bethann Berkshire [phone]) on 10/06/2018 10:55:16 PM   Radiology No results found.  Procedures Procedures (including critical care time)  Medications Ordered in ED Medications  acetaminophen (TYLENOL) tablet 650 mg (650 mg Oral Given 10/06/18 2115)     Initial Impression / Assessment and Plan / ED Course  I have reviewed the triage vital signs and the nursing notes.  Pertinent labs & imaging results that were available during my care of the patient were reviewed by me and considered in my medical decision making (see chart for details).     Patient improved with some Tylenol and eating some food.  His glucose was 120 here.  He was told to make sure he ate 3 meals a day and to stop taking his Glucophage right now.  He will check his sugar daily and see his PCP next week  Final Clinical Impressions(s) / ED Diagnoses   Final diagnoses:  Hypoglycemia    ED Discharge Orders    None       Bethann Berkshire, MD 10/06/18 2258

## 2018-10-06 NOTE — ED Notes (Signed)
Pt given a sandwich

## 2018-10-06 NOTE — Discharge Instructions (Addendum)
Make sure you eat 3 meals a day.  Check your sugar at least once a day.  Stop taking your diabetes medicine and follow-up with your PCP next week

## 2018-10-06 NOTE — ED Notes (Addendum)
Pt stated he had 2 cookies while waiting in the lobby. Also, he hasn't taken any of his diabetes medication today. He couldn't remember the name of it.

## 2018-10-06 NOTE — ED Triage Notes (Addendum)
Patient states he thought his blood sugar ws low because he was not feeling good. Patient states that he is currently out of his strips and was unable to check. Patient states after eating salad today, he became dizzy and states he was a little dizzy prior to coming to the ED. Patient states he has not eaten what he normally does. Patient c/o headache today.

## 2018-10-09 LAB — CBG MONITORING, ED: Glucose-Capillary: 88 mg/dL (ref 70–99)

## 2019-09-07 ENCOUNTER — Emergency Department (HOSPITAL_COMMUNITY)
Admission: EM | Admit: 2019-09-07 | Discharge: 2019-09-07 | Disposition: A | Discharge status: Home / Self Care | Payer: BLUE CROSS/BLUE SHIELD | Attending: Emergency Medicine | Admitting: Emergency Medicine

## 2019-09-07 ENCOUNTER — Other Ambulatory Visit: Payer: Self-pay

## 2019-09-07 ENCOUNTER — Encounter (HOSPITAL_COMMUNITY): Payer: Self-pay

## 2019-09-07 ENCOUNTER — Emergency Department (HOSPITAL_COMMUNITY): Payer: BLUE CROSS/BLUE SHIELD

## 2019-09-07 DIAGNOSIS — Z79899 Other long term (current) drug therapy: Secondary | ICD-10-CM | POA: Insufficient documentation

## 2019-09-07 DIAGNOSIS — E119 Type 2 diabetes mellitus without complications: Secondary | ICD-10-CM | POA: Diagnosis not present

## 2019-09-07 DIAGNOSIS — R079 Chest pain, unspecified: Secondary | ICD-10-CM | POA: Insufficient documentation

## 2019-09-07 LAB — BASIC METABOLIC PANEL
Anion gap: 8 (ref 5–15)
BUN: 14 mg/dL (ref 6–20)
CO2: 26 mmol/L (ref 22–32)
Calcium: 9.4 mg/dL (ref 8.9–10.3)
Chloride: 104 mmol/L (ref 98–111)
Creatinine, Ser: 1.24 mg/dL (ref 0.61–1.24)
GFR calc Af Amer: >60 mL/min (ref >60)
GFR calc non Af Amer: >60 mL/min (ref >60)
Glucose, Bld: 139 mg/dL — ABNORMAL HIGH (ref 70–99)
Potassium: 3.9 mmol/L (ref 3.5–5.1)
Sodium: 138 mmol/L (ref 135–145)

## 2019-09-07 LAB — CBC
HCT: 43.9 % (ref 39.0–52.0)
Hemoglobin: 14.7 g/dL (ref 13.0–17.0)
MCH: 30.8 pg (ref 26.0–34.0)
MCHC: 33.5 g/dL (ref 30.0–36.0)
MCV: 92 fL (ref 80.0–100.0)
Platelets: 279 10*3/uL (ref 150–400)
RBC: 4.77 MIL/uL (ref 4.22–5.81)
RDW: 12.8 % (ref 11.5–15.5)
WBC: 3.5 10*3/uL — ABNORMAL LOW (ref 4.0–10.5)
nRBC: 0 % (ref 0.0–0.2)

## 2019-09-07 LAB — TROPONIN I (HIGH SENSITIVITY)
Troponin I (High Sensitivity): <2 ng/L (ref <18)
Troponin I (High Sensitivity): <2 ng/L (ref <18)

## 2019-09-07 MED ORDER — SODIUM CHLORIDE 0.9% FLUSH: 3.0000 mL | Freq: Once | INTRAVENOUS | Status: DC

## 2019-09-07 NOTE — ED Provider Notes (Signed)
[facility]-EMERGENCY DEPT Provider Note   CSN: [id number] Arrival date & time: 09/07/19  1701     History   Chief Complaint Chief Complaint  Patient presents with  . Chest Pain    HPI Anthony Walker is a 58 y.o. male.     The history is provided by the patient and medical records. No language interpreter was used.  Chest Pain  Anthony Walker is a 58 y.o. male  with a PMH of DM who presents to the Emergency Department complaining of intermittent chest pains which have been ongoing for last 3 or 4 weeks.  Pain is bilateral across the entire chest wall.  He was seen at urgent care who started patient on omeprazole.  He took this for a week as was recommended and started to feel better.  After the week ended, he discontinued use and chest pain did seem to become worse and return.  He started taking this as needed, typically only 2 or 3 times a week.  He feels as if it helps.  He denies any associated shortness of breath.  He actually plays soccer regularly and runs a good bit while playing soccer without any exacerbation of his chest pain during this physical activity.  Denies any diaphoresis, nausea or vomiting.  No bowel habitus changes.  No fevers.  No cough or congestion.  No lower extremity swelling or pain.  No long travel/surgery/immobilizations recently.   Past Medical History:  Diagnosis Date  . Diabetes mellitus without complication (HCC)   . Seasonal     There are no active problems to display for this patient.   No past surgical history on file.      Home Medications    Prior to Admission medications   Medication Sig Start Date End Date Taking? Authorizing Provider  omeprazole (PRILOSEC) 20 MG capsule Take 20 mg by mouth daily as needed (heartburn).  07/14/19  Yes [provider]  acetaminophen (TYLENOL) 325 MG tablet Take 2 tablets (650 mg total) by mouth every 6 (six) hours as needed. Patient not taking: Reported on  10/06/2018 05/01/18   Aviva Kluver B, PA-C  cephALEXin (KEFLEX) 500 MG capsule Take 1 capsule (500 mg total) by mouth 4 (four) times daily. Patient not taking: Reported on 10/06/2018 05/01/18   Aviva Kluver B, PA-C  ibuprofen (ADVIL,MOTRIN) 600 MG tablet Take 1 tablet (600 mg total) by mouth every 6 (six) hours as needed. Patient not taking: Reported on 10/06/2018 05/01/18   Elisha Ponder, PA-C    Family History Family History  Problem Relation Age of Onset  . Hypertension Mother   . Diabetes Father   . Parkinson's disease Father     Social History Social History   Tobacco Use  . Smoking status: Never Smoker  . Smokeless tobacco: Never Used  Substance Use Topics  . Alcohol use: No  . Drug use: No     Allergies   Patient has no known allergies.   Review of Systems Review of Systems  Cardiovascular: Positive for chest pain.  All other systems reviewed and are negative.    Physical Exam Updated Vital Signs BP 120/84   Pulse 67   Temp 98.1 F (36.7 C) (Oral)   Resp 16   Ht 5\' 5"  (1.651 m)   Wt 78.9 kg   SpO2 100%   BMI 28.96 kg/m   Physical Exam Vitals signs and nursing note reviewed.  Constitutional:      General: He  is not in acute distress.    Appearance: He is well-developed.  HENT:     Head: Normocephalic and atraumatic.  Neck:     Musculoskeletal: Neck supple.  Cardiovascular:     Rate and Rhythm: Normal rate and regular rhythm.     Heart sounds: Normal heart sounds. No murmur.  Pulmonary:     Effort: Pulmonary effort is normal. No respiratory distress.     Breath sounds: Normal breath sounds.  Abdominal:     General: There is no distension.     Palpations: Abdomen is soft.     Tenderness: There is no abdominal tenderness.  Musculoskeletal:     Right lower leg: He exhibits no tenderness. No edema.     Left lower leg: He exhibits no tenderness. No edema.  Skin:    General: Skin is warm and dry.  Neurological:     Mental Status: He is alert  and oriented to person, place, and time.      ED Treatments / Results  Labs (all labs ordered are listed, but only abnormal results are displayed) Labs Reviewed  BASIC METABOLIC PANEL - Abnormal; Notable for the following components:      Result Value   Glucose, Bld 139 (*)    All other components within normal limits  CBC - Abnormal; Notable for the following components:   WBC 3.5 (*)    All other components within normal limits  TROPONIN I (HIGH SENSITIVITY)  TROPONIN I (HIGH SENSITIVITY)    EKG EKG Interpretation  Date/Time:  Tuesday September 07 2019 17:11:13 EDT Ventricular Rate:  75 PR Interval:    QRS Duration: 90 QT Interval:  370 QTC Calculation: 414 R Axis:   92 Text Interpretation:  Sinus rhythm Borderline right axis deviation Minimal ST elevation, inferior leads Confirmed by Geoffery LyonseLo, Douglas ([id number]) on 09/07/2019 5:13:54 PM   Radiology Dg Chest 2 View  Result Date: 09/07/2019 CLINICAL DATA:  58 year old male with chest pain. EXAM: CHEST - 2 VIEW COMPARISON:  Chest radiograph dated 10/02/2009 FINDINGS: The lungs are clear. There is no pleural effusion or pneumothorax. The cardiac silhouette is within normal limits. No acute osseous pathology. Mild atherosclerotic calcification of the aortic arch. IMPRESSION: No active cardiopulmonary disease. Electronically Signed   By: Elgie CollardArash  Radparvar M.D.   On: 09/07/2019 17:44    Procedures Procedures (including critical care time)  Medications Ordered in ED Medications - No data to display   Initial Impression / Assessment and Plan / ED Course  I have reviewed the triage vital signs and the nursing notes.  Pertinent labs & imaging results that were available during my care of the patient were reviewed by me and considered in my medical decision making (see chart for details).       Anthony Walker is a 58 y.o. male who presents to ED for intermittent chest pain for several weeks. On exam, patient is well-appearing,  hemodynamically stable with normal cardiopulmonary exam. Chest pain free currently.    Labs reviewed and reassuring with negative troponin x2.  CXR with no acute abnormalities.  EKG without acute ischemic changes.   Low risk heart score of 3. Low risk well's. Doubt PE  Patient's symptoms unlikely to be of cardiac etiology. Labs and imaging reviewed again prior to discharge. Patient has been advised to return to the ED if development of any exertional chest pain, trouble breathing, new/worsening symptoms or for any additional concerns. Evaluation does not show pathology that would require ongoing emergent intervention  or inpatient treatment. Encouraged to follow up with PCP. Referral information included in discharge paperwork. Patient understands return precautions and follow up plan. All questions answered.  Final Clinical Impressions(s) / ED Diagnoses   Final diagnoses:  Chest pain with low risk for cardiac etiology    ED Discharge Orders    None       Chapman Matteucci, Ozella Almond, PA-C 09/07/19 2049    Veryl Speak, MD 09/07/19 2235

## 2019-09-07 NOTE — Discharge Instructions (Addendum)
It was my pleasure taking care of you today!  Continue taking omeprazole daily.  Call the clinic listed to schedule a follow-up appointment.  Return to ER for new or worsening symptoms, any additional concerns.

## 2019-09-07 NOTE — ED Triage Notes (Signed)
Patient c/o chest pain x 2 weeks. Patient states that he went to an UC and was prescribed Omeprazole. Patient states this medicine helped some, but returns. Patient states he get SOB at times.

## 2020-09-01 ENCOUNTER — Other Ambulatory Visit: Payer: Self-pay

## 2020-09-01 ENCOUNTER — Other Ambulatory Visit: Payer: BLUE CROSS/BLUE SHIELD

## 2020-09-01 DIAGNOSIS — Z20822 Contact with and (suspected) exposure to covid-19: Secondary | ICD-10-CM

## 2020-09-02 LAB — NOVEL CORONAVIRUS, NAA: SARS-CoV-2, NAA: NOT DETECTED

## 2021-10-21 ENCOUNTER — Emergency Department (HOSPITAL_COMMUNITY): Payer: BLUE CROSS/BLUE SHIELD

## 2021-10-21 ENCOUNTER — Encounter (HOSPITAL_COMMUNITY): Payer: Self-pay

## 2021-10-21 ENCOUNTER — Other Ambulatory Visit: Payer: Self-pay

## 2021-10-21 ENCOUNTER — Emergency Department (HOSPITAL_COMMUNITY)
Admission: EM | Admit: 2021-10-21 | Discharge: 2021-10-21 | Disposition: A | Discharge status: Home / Self Care | Payer: BLUE CROSS/BLUE SHIELD | Attending: Emergency Medicine | Admitting: Emergency Medicine

## 2021-10-21 DIAGNOSIS — R079 Chest pain, unspecified: Secondary | ICD-10-CM

## 2021-10-21 DIAGNOSIS — E119 Type 2 diabetes mellitus without complications: Secondary | ICD-10-CM | POA: Insufficient documentation

## 2021-10-21 DIAGNOSIS — R0789 Other chest pain: Secondary | ICD-10-CM | POA: Insufficient documentation

## 2021-10-21 LAB — CBC WITH DIFFERENTIAL/PLATELET
Abs Immature Granulocytes: 0 10*3/uL (ref 0.00–0.07)
Basophils Absolute: 0 10*3/uL (ref 0.0–0.1)
Basophils Relative: 0 %
Eosinophils Absolute: 0.2 10*3/uL (ref 0.0–0.5)
Eosinophils Relative: 4 %
HCT: 43.5 % (ref 39.0–52.0)
Hemoglobin: 14.6 g/dL (ref 13.0–17.0)
Immature Granulocytes: 0 %
Lymphocytes Relative: 35 %
Lymphs Abs: 1.6 10*3/uL (ref 0.7–4.0)
MCH: 29.9 pg (ref 26.0–34.0)
MCHC: 33.6 g/dL (ref 30.0–36.0)
MCV: 89.1 fL (ref 80.0–100.0)
Monocytes Absolute: 0.6 10*3/uL (ref 0.1–1.0)
Monocytes Relative: 12 %
Neutro Abs: 2.3 10*3/uL (ref 1.7–7.7)
Neutrophils Relative %: 49 %
Platelets: 279 10*3/uL (ref 150–400)
RBC: 4.88 MIL/uL (ref 4.22–5.81)
RDW: 12.9 % (ref 11.5–15.5)
WBC: 4.6 10*3/uL (ref 4.0–10.5)
nRBC: 0 % (ref 0.0–0.2)

## 2021-10-21 LAB — BASIC METABOLIC PANEL
Anion gap: 7 (ref 5–15)
BUN: 20 mg/dL (ref 6–20)
CO2: 26 mmol/L (ref 22–32)
Calcium: 9 mg/dL (ref 8.9–10.3)
Chloride: 102 mmol/L (ref 98–111)
Creatinine, Ser: 1.15 mg/dL (ref 0.61–1.24)
GFR, Estimated: >60 mL/min (ref >60)
Glucose, Bld: 229 mg/dL — ABNORMAL HIGH (ref 70–99)
Potassium: 3.8 mmol/L (ref 3.5–5.1)
Sodium: 135 mmol/L (ref 135–145)

## 2021-10-21 LAB — TROPONIN I (HIGH SENSITIVITY)
Troponin I (High Sensitivity): 3 ng/L (ref <18)
Troponin I (High Sensitivity): 2 ng/L (ref <18)

## 2021-10-21 MED ORDER — PANTOPRAZOLE SODIUM 40 MG PO TBEC: 40.0000 mg | DELAYED_RELEASE_TABLET | Freq: Every day | ORAL | 0 refills | Status: DC

## 2021-10-21 MED ORDER — ALUM & MAG HYDROXIDE-SIMETH 200-200-20 MG/5ML PO SUSP: 30.0000 mL | Freq: Once | ORAL | Status: DC

## 2021-10-21 NOTE — Discharge Instructions (Signed)
Please take the pantoprazole every day for the next 2 weeks.  After that, you may use it as needed.  Take antacids as needed.  Return if symptoms are getting worse.

## 2021-10-21 NOTE — ED Provider Notes (Signed)
[facility]-EMERGENCY DEPT Provider Note   CSN: [id number] Arrival date & time: 10/21/21  0030     History Chief Complaint  Patient presents with   Chest Pain    Anthony Walker is a 60 y.o. male.  The history is provided by the patient.  Chest Pain He has history of prediabetes, currently not on any medication, and comes in because of chest pains which started about 10 PM.  He describes a burning sensation in the left side of his chest without radiation.  Pain will last for up to about a minute before resolving.  It is relatively mild although he has difficulty putting a number on it.  Nothing makes it better, nothing makes it worse.  There is no associated dyspnea, nausea, diaphoresis.  He has had pain like this in the past and had been taking pantoprazole, but he stopped taking pantoprazole.  When pain came back this evening, he took a dose of pantoprazole.  He is a non-smoker and denies history of hypertension or hyperlipidemia and denies family history of premature coronary atherosclerosis.   Past Medical History:  Diagnosis Date   Diabetes mellitus without complication (HCC)    Seasonal     There are no problems to display for this patient.   History reviewed. No pertinent surgical history.     Family History  Problem Relation Age of Onset   Hypertension Mother    Diabetes Father    Parkinson's disease Father     Social History   Tobacco Use   Smoking status: Never   Smokeless tobacco: Never  Vaping Use   Vaping Use: Never used  Substance Use Topics   Alcohol use: No   Drug use: No    Home Medications Prior to Admission medications   Medication Sig Start Date End Date Taking? Authorizing Provider  acetaminophen (TYLENOL) 325 MG tablet Take 2 tablets (650 mg total) by mouth every 6 (six) hours as needed. Patient not taking: Reported on 10/06/2018 05/01/18   Aviva Kluver B, PA-C  cephALEXin (KEFLEX) 500 MG capsule Take 1 capsule  (500 mg total) by mouth 4 (four) times daily. Patient not taking: Reported on 10/06/2018 05/01/18   Aviva Kluver B, PA-C  ibuprofen (ADVIL,MOTRIN) 600 MG tablet Take 1 tablet (600 mg total) by mouth every 6 (six) hours as needed. Patient not taking: Reported on 10/06/2018 05/01/18   Aviva Kluver B, PA-C  omeprazole (PRILOSEC) 20 MG capsule Take 20 mg by mouth daily as needed (heartburn).  07/14/19   [provider]    Allergies    Patient has no known allergies.  Review of Systems   Review of Systems  Cardiovascular:  Positive for chest pain.  All other systems reviewed and are negative.  Physical Exam Updated Vital Signs BP (!) 147/86   Pulse 92   Temp 97.7 F (36.5 C) (Oral)   Resp 18   Ht 5\' 5"  (1.651 m)   Wt 74.4 kg   SpO2 100%   BMI 27.29 kg/m   Physical Exam Vitals and nursing note reviewed.  60 year old male, resting comfortably and in no acute distress. Vital signs are significant for borderline elevated blood pressure. Oxygen saturation is 100%, which is normal. Head is normocephalic and atraumatic. PERRLA, EOMI. Oropharynx is clear. Neck is nontender and supple without adenopathy or JVD. Back is nontender and there is no CVA tenderness. Lungs are clear without rales, wheezes, or rhonchi. Chest is nontender. Heart has regular rate  and rhythm without murmur. Abdomen is soft, flat, nontender. Extremities have no cyanosis or edema, full range of motion is present. Skin is warm and dry without rash. Neurologic: Mental status is normal, cranial nerves are intact, moves all extremities equally.  ED Results / Procedures / Treatments   Labs (all labs ordered are listed, but only abnormal results are displayed) Labs Reviewed  BASIC METABOLIC PANEL - Abnormal; Notable for the following components:      Result Value   Glucose, Bld 229 (*)    All other components within normal limits  CBC WITH DIFFERENTIAL/PLATELET  TROPONIN I (HIGH SENSITIVITY)  TROPONIN I  (HIGH SENSITIVITY)    EKG EKG Interpretation  Date/Time:  Sunday October 21 2021 00:50:33 EDT Ventricular Rate:  84 PR Interval:  169 QRS Duration: 93 QT Interval:  356 QTC Calculation: 421 R Axis:   94 Text Interpretation: Sinus rhythm Borderline right axis deviation When compared with ECG of 09/07/2019, No significant change was found Confirmed by Dustee Bottenfield ([id number]) on 10/21/2021 12:53:30 AM  Radiology DG Chest 2 View  Result Date: 10/21/2021 CLINICAL DATA:  Chest pain.  Left-sided pain, onset today. EXAM: CHEST - 2 VIEW COMPARISON:  Radiograph 09/07/2019 FINDINGS: The cardiomediastinal contours are normal. Mild atherosclerosis of the aortic arch. The lungs are clear. Pulmonary vasculature is normal. No consolidation, pleural effusion, or pneumothorax. No acute osseous abnormalities are seen. IMPRESSION: No acute chest findings. Electronically Signed   By: Melanie  Sanford M.D.   On: 10/21/2021 01:35    Procedures Procedures   Medications Ordered in ED Medications  alum & mag hydroxide-simeth (MAALOX/MYLANTA) 200-200-20 MG/5ML suspension 30 mL (has no administration in time range)    ED Course  I have reviewed the triage vital signs and the nursing notes.  Pertinent labs & imaging results that were available during my care of the patient were reviewed by me and considered in my medical decision making (see chart for details).   MDM Rules/Calculators/A&P                         Chest pain which seems quite atypical, strongly suspect GERD.  ECG is unremarkable and unchanged from prior.  Old records are reviewed, and he does have 1 prior ED visit for evaluation of chest pain.  We will check screening labs including troponin x2, chest x-ray.  We will give a dose of Maalox.  Heart score is 1 which puts him at low risk for major adverse cardiac events.  He feels significantly better following above-noted treatment.  Labs are unremarkable including normal troponin x2.  Patient is  reassured of negative work-up.  He is given a new prescription for pantoprazole and advised to use over-the-counter antacids as needed.  Return precautions discussed.  Final Clinical Impression(s) / ED Diagnoses Final diagnoses:  Chest pain with low risk for cardiac etiology    Rx / DC Orders ED Discharge Orders          Ordered    pantoprazole (PROTONIX) 40 MG tablet  Daily        10 /23/22 0458             12-30-1990, MD 10/21/21 0500

## 2021-10-21 NOTE — ED Triage Notes (Signed)
Pt to ED by POV from home with c/o L sided CP which began earlier today. Arrives A+O  VSS, NADN. Pt states the pain radiates to his shoulders.

## 2022-09-12 IMAGING — CR DG CHEST 2V
2 series · 2 of 2 positions shown · non-contrast
Comparison: Radiograph 09/07/2019

CLINICAL DATA: Chest pain.  Left-sided pain, onset today.

EXAM:
CHEST - 2 VIEW

[w chest pa]
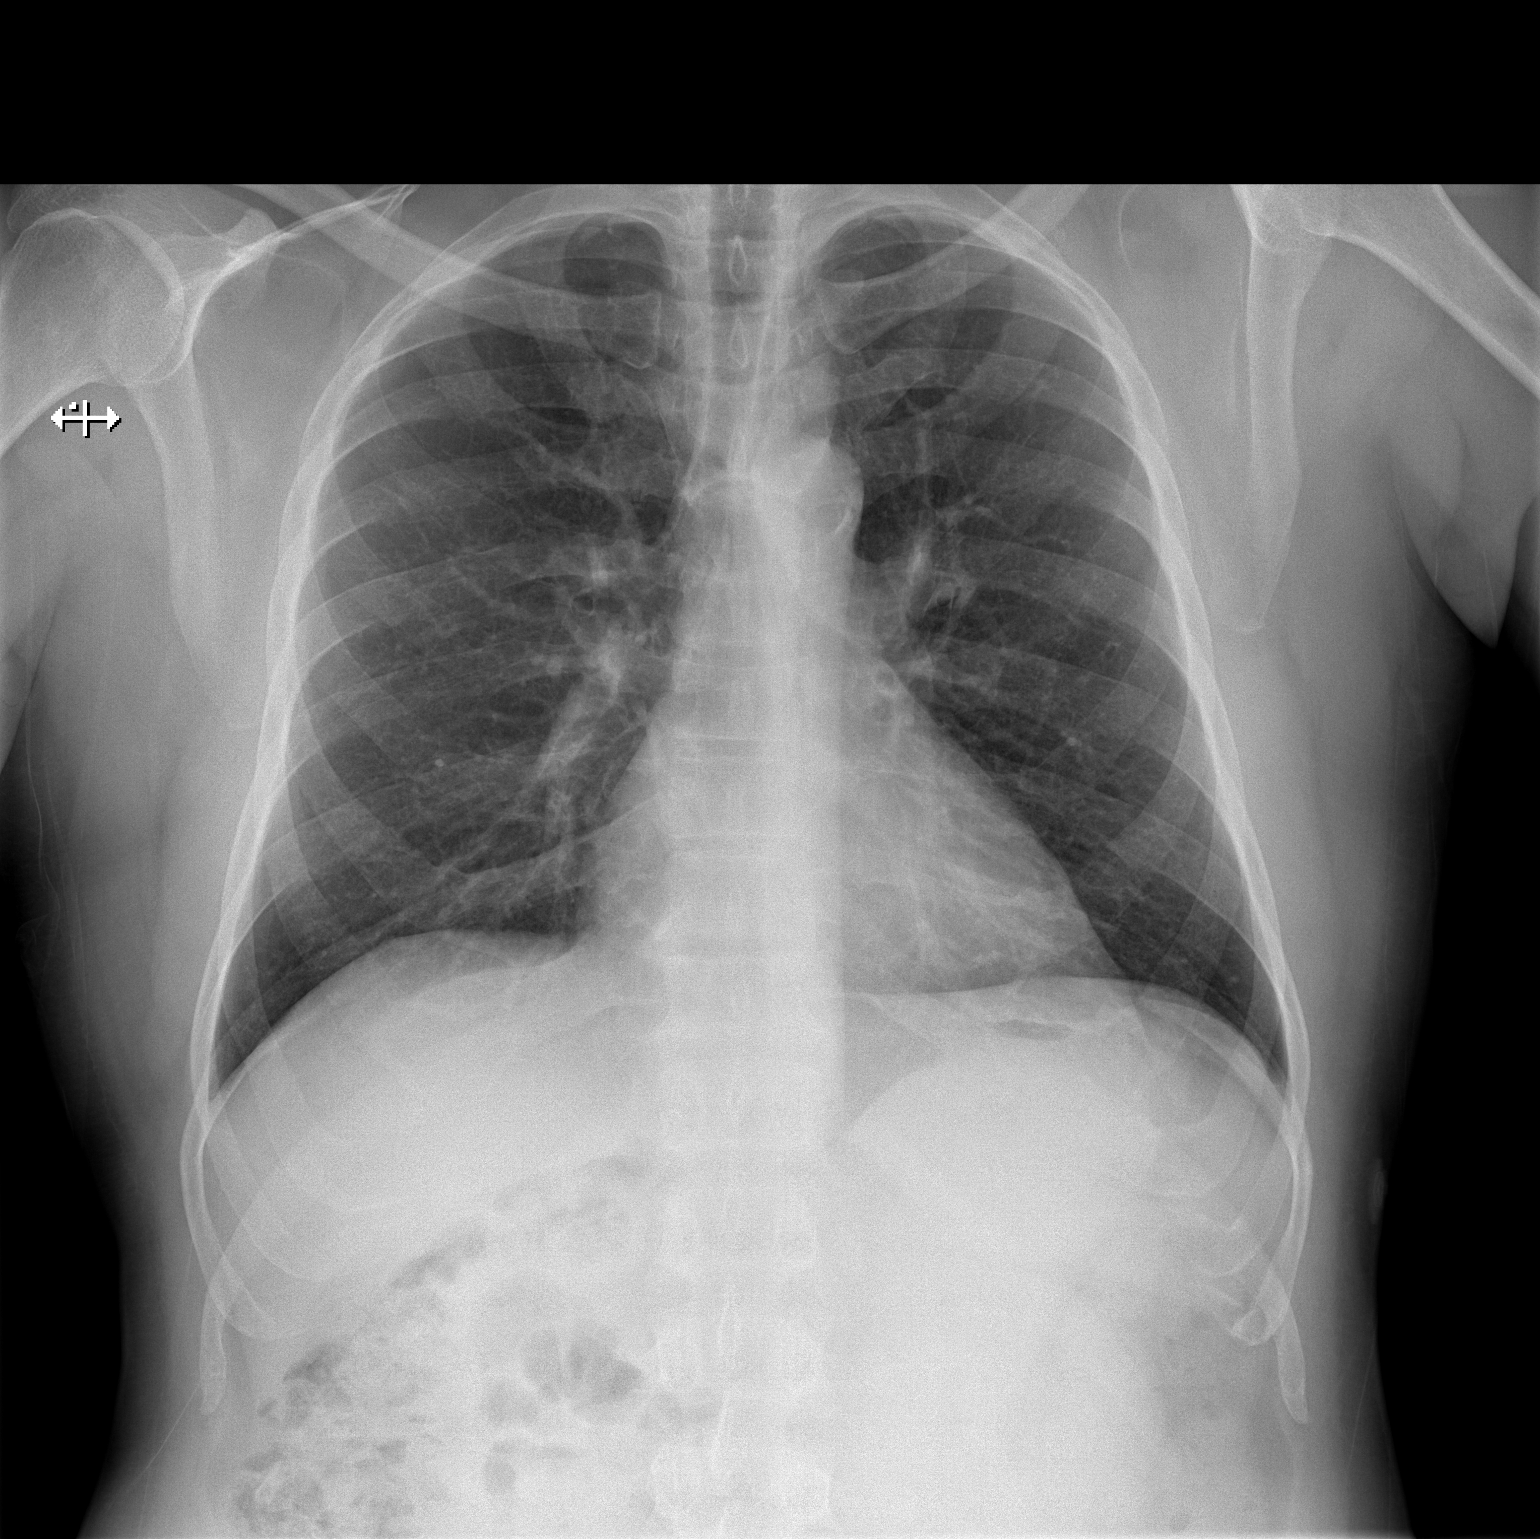

[w chest lat]
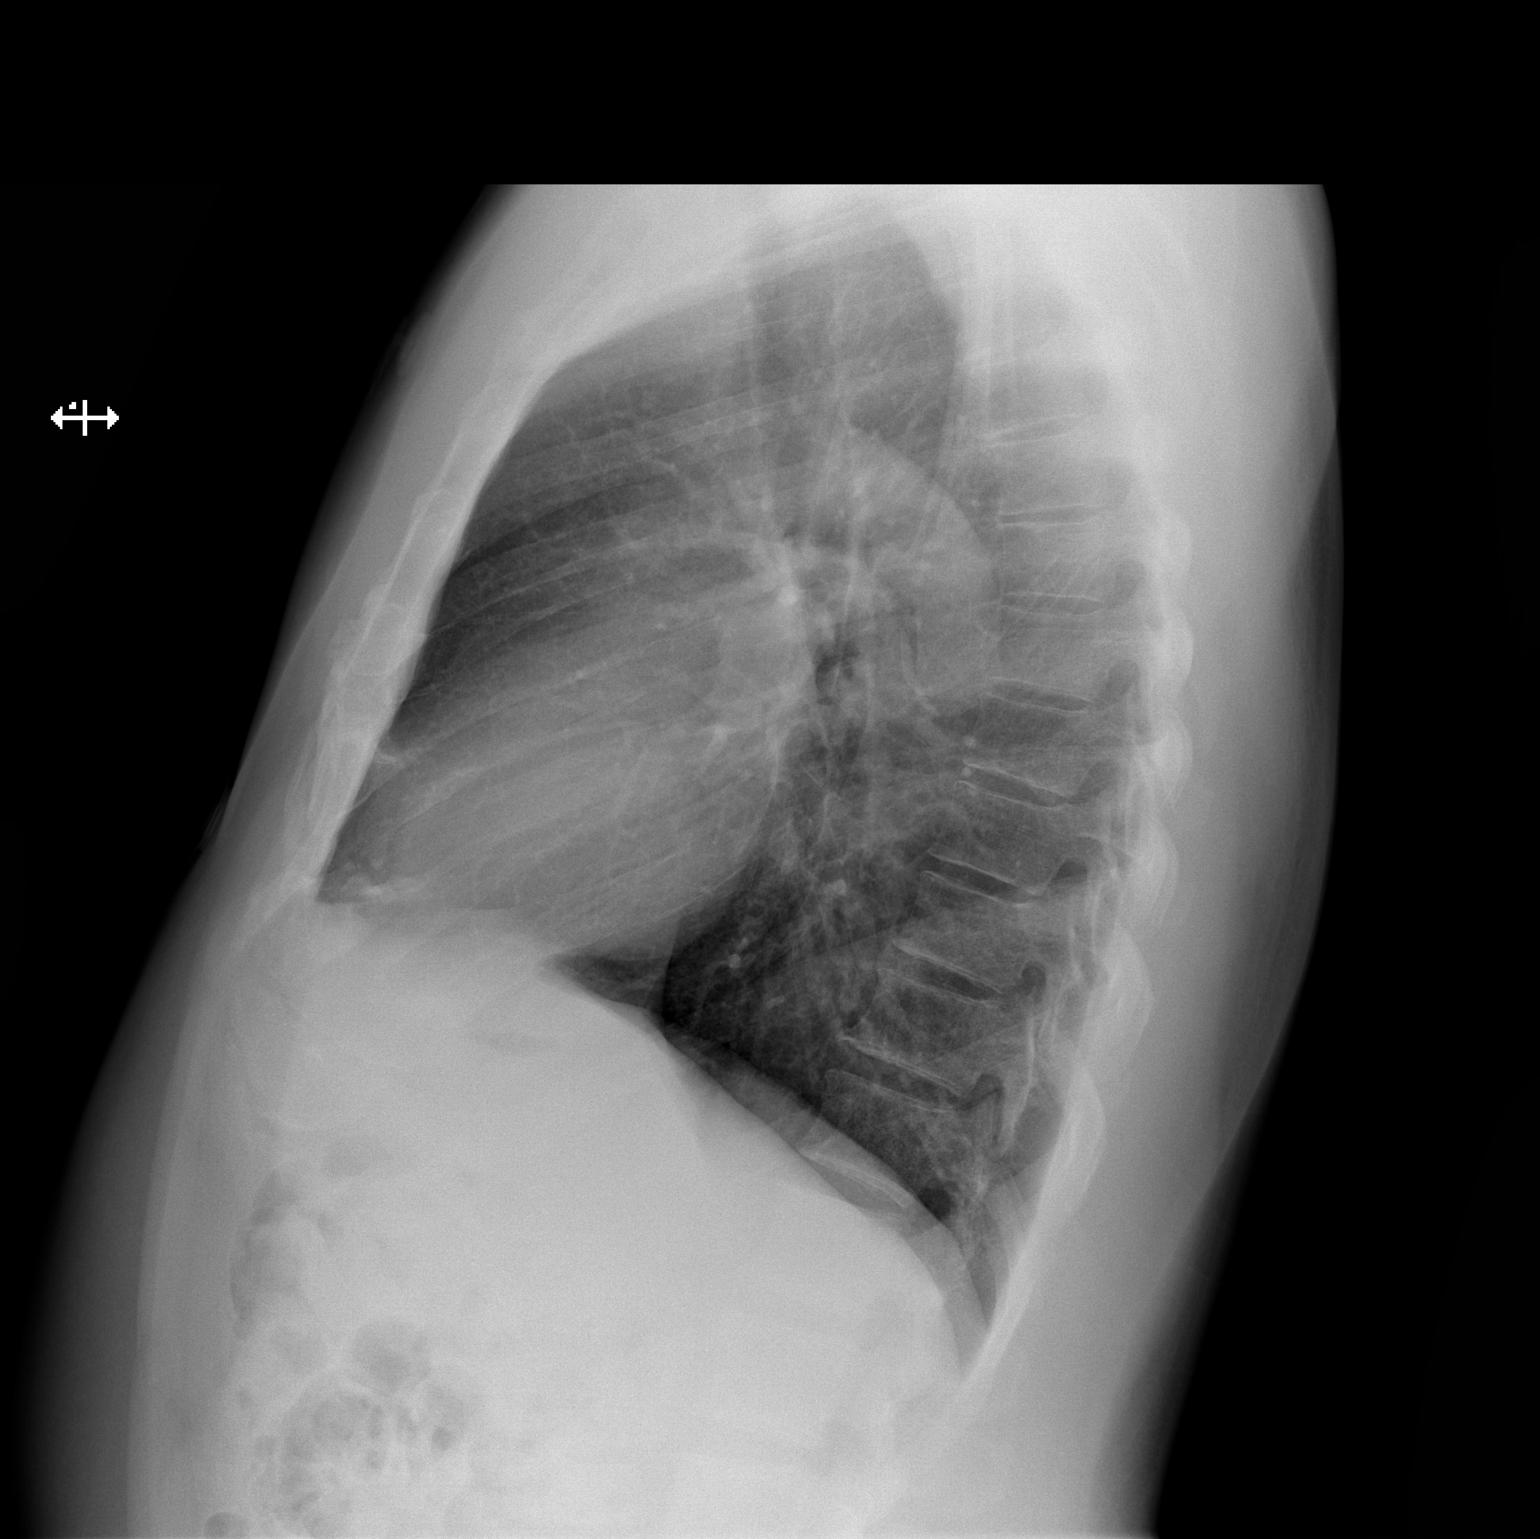

[2 of 2 positions shown; findings below may reference images not displayed]

FINDINGS: The cardiomediastinal contours are normal. Mild atherosclerosis of
the aortic arch. The lungs are clear. Pulmonary vasculature is
normal. No consolidation, pleural effusion, or pneumothorax. No
acute osseous abnormalities are seen.
IMPRESSION: No acute chest findings.

## 2022-09-19 ENCOUNTER — Ambulatory Visit (HOSPITAL_COMMUNITY)
Admission: EM | Admit: 2022-09-19 | Discharge: 2022-09-19 | Disposition: A | Discharge status: Home / Self Care | Payer: BLUE CROSS/BLUE SHIELD | Attending: Family Medicine | Admitting: Family Medicine

## 2022-09-19 ENCOUNTER — Encounter (HOSPITAL_COMMUNITY): Payer: Self-pay

## 2022-09-19 DIAGNOSIS — R0789 Other chest pain: Secondary | ICD-10-CM

## 2022-09-19 DIAGNOSIS — K219 Gastro-esophageal reflux disease without esophagitis: Secondary | ICD-10-CM | POA: Diagnosis not present

## 2022-09-19 MED ORDER — ALUM & MAG HYDROXIDE-SIMETH 200-200-20 MG/5ML PO SUSP
30.0000 mL | Freq: Once | ORAL | Status: AC
  Administered 2022-09-19: 30 mL via ORAL

## 2022-09-19 MED ORDER — LIDOCAINE VISCOUS HCL 2 % MT SOLN
OROMUCOSAL | Status: AC
  Filled 2022-09-19: qty 15

## 2022-09-19 MED ORDER — PANTOPRAZOLE SODIUM 40 MG PO TBEC: 40.0000 mg | DELAYED_RELEASE_TABLET | Freq: Every day | ORAL | 0 refills | Status: AC

## 2022-09-19 MED ORDER — ALUM & MAG HYDROXIDE-SIMETH 200-200-20 MG/5ML PO SUSP
ORAL | Status: AC
  Filled 2022-09-19: qty 30

## 2022-09-19 MED ORDER — LIDOCAINE VISCOUS HCL 2 % MT SOLN
15.0000 mL | Freq: Once | OROMUCOSAL | Status: AC
  Administered 2022-09-19: 15 mL via ORAL

## 2022-09-19 MED ORDER — SUCRALFATE 1 G PO TABS: 1.0000 g | ORAL_TABLET | Freq: Two times a day (BID) | ORAL | 0 refills | Status: AC

## 2022-09-19 NOTE — ED Triage Notes (Signed)
Patient having chest pain since last night. States that it feels like heart burn. Patient states th last time his chest felt like this he was told it was heart burn.   Patient states he has been burping. Chest is burning and sharp pain across the chest. States there is a shooting from the left to the right.   No medical history of any heart conditions. Slight dizziness on the way here.

## 2022-09-19 NOTE — Discharge Instructions (Addendum)
Your EKG was normal  You were given a GI cocktail, which is a combination of antiacid and lidocaine.  Take pantoprazole 40 mg 1 daily for a couple of weeks.  Take sucralfate 1 g--1 tablet 2 times daily by mouth.  You can crush the tablet and a spoon and add a little water and take it as a slurry.  This will coat your esophagus better  Follow-up with your primary care

## 2022-09-19 NOTE — ED Provider Notes (Signed)
[facility]    CSN: [id number] Arrival date & time: 09/19/22  1822      History   Chief Complaint Chief Complaint  Patient presents with   Chest Pain    HPI Anthony Walker is a 61 y.o. male.    Chest Pain  Here with pain that began yesterday evening or afternoon.  It is in his central chest and toward the left chest.  He has been burping some since this started, but that does not really relieve the pain.  No fever or chills or cough or congestion.  No nausea or vomiting or diarrhea.  Bowel movements have been normal.  The pain is described as a burning.  He was seen in the emergency room in October 22, and Maalox relieved his pain and pantoprazole helped him feel better.  Past Medical History:  Diagnosis Date   Diabetes mellitus without complication ([place])    Seasonal     There are no problems to display for this patient.   History reviewed. No pertinent surgical history.     Home Medications    Prior to Admission medications   Medication Sig Start Date End Date Taking? Authorizing Provider  sucralfate (CARAFATE) 1 g tablet Take 1 tablet (1 g total) by mouth 2 (two) times daily. 09/19/22  Yes Barrett Henle, MD  pantoprazole (PROTONIX) 40 MG tablet Take 1 tablet (40 mg total) by mouth daily. 09/19/22   Barrett Henle, MD    Family History Family History  Problem Relation Age of Onset   Hypertension Mother    Diabetes Father    Parkinson's disease Father     Social History Social History   Tobacco Use   Smoking status: Never   Smokeless tobacco: Never  Vaping Use   Vaping Use: Never used  Substance Use Topics   Alcohol use: No   Drug use: No     Allergies   Patient has no known allergies.   Review of Systems Review of Systems  Cardiovascular:  Positive for chest pain.     Physical Exam Triage Vital Signs ED Triage Vitals  Enc Vitals Group     BP 09/19/22 1903 127/74     Pulse Rate 09/19/22 1903 75     Resp  09/19/22 1903 16     Temp 09/19/22 1903 98 F (36.7 C)     Temp Source 09/19/22 1903 Oral     SpO2 09/19/22 1903 100 %     Weight --      Height --      Head Circumference --      Peak Flow --      Pain Score 09/19/22 1904 7     Pain Loc --      Pain Edu? --      Excl. in [place]? --    No data found.  Updated Vital Signs BP 127/74 (BP Location: Right Arm)   Pulse 75   Temp 98 F (36.7 C) (Oral)   Resp 16   SpO2 100%   Visual Acuity Right Eye Distance:   Left Eye Distance:   Bilateral Distance:    Right Eye Near:   Left Eye Near:    Bilateral Near:     Physical Exam Vitals reviewed.  Constitutional:      General: He is not in acute distress.    Appearance: He is not ill-appearing, toxic-appearing or diaphoretic.  HENT:     Mouth/Throat:     Mouth:  Mucous membranes are moist.  Eyes:     Extraocular Movements: Extraocular movements intact.     Pupils: Pupils are equal, round, and reactive to light.  Cardiovascular:     Rate and Rhythm: Normal rate and regular rhythm.     Heart sounds: No murmur heard. Pulmonary:     Effort: Pulmonary effort is normal. No respiratory distress.     Breath sounds: No stridor. No wheezing, rhonchi or rales.  Chest:     Chest wall: No tenderness.  Abdominal:     General: There is no distension.     Palpations: Abdomen is soft. There is no mass.     Tenderness: There is no abdominal tenderness. There is no guarding.  Musculoskeletal:     Cervical back: Neck supple.  Lymphadenopathy:     Cervical: No cervical adenopathy.  Skin:    Coloration: Skin is not jaundiced or pale.  Neurological:     General: No focal deficit present.     Mental Status: He is alert and oriented to person, place, and time.  Psychiatric:        Behavior: Behavior normal.      UC Treatments / Results  Labs (all labs ordered are listed, but only abnormal results are displayed) Labs Reviewed - No data to display  EKG   Radiology No results  found.  Procedures Procedures (including critical care time)  Medications Ordered in UC Medications  alum & mag hydroxide-simeth (MAALOX/MYLANTA) 200-200-20 MG/5ML suspension 30 mL (30 mLs Oral Given 09/19/22 2006)    And  lidocaine (XYLOCAINE) 2 % viscous mouth solution 15 mL (15 mLs Oral Given 09/19/22 2006)    Initial Impression / Assessment and Plan / UC Course  I have reviewed the triage vital signs and the nursing notes.  Pertinent labs & imaging results that were available during my care of the patient were reviewed by me and considered in my medical decision making (see chart for details).        EKG shows normal sinus rhythm and is without ST changes.  Is essentially unchanged from previous in our chart.  He is given a dose of GI cocktail here.  About 10 minutes later he is a good bit improved.  I have sent in his pantoprazole 40 mg and sucralfate also, to be taken in a slurry.  Have asked him to follow-up with his primary care.  He is given information on dietary measures that can help reflux be better Final Clinical Impressions(s) / UC Diagnoses   Final diagnoses:  Atypical chest pain  Gastroesophageal reflux disease without esophagitis     Discharge Instructions      Your EKG was normal  You were given a GI cocktail, which is a combination of antiacid and lidocaine.  Take pantoprazole 40 mg 1 daily for a couple of weeks.  Take sucralfate 1 g--1 tablet 2 times daily by mouth.  You can crush the tablet and a spoon and add a little water and take it as a slurry.  This will coat your esophagus better  Follow-up with your primary care     ED Prescriptions     Medication Sig Dispense Auth. Provider   pantoprazole (PROTONIX) 40 MG tablet Take 1 tablet (40 mg total) by mouth daily. 30 tablet Nicklos Gaxiola, Gwenlyn Perking, MD   sucralfate (CARAFATE) 1 g tablet Take 1 tablet (1 g total) by mouth 2 (two) times daily. 60 tablet Landan Fedie, Gwenlyn Perking, MD      PDMP  not reviewed  this encounter.   Barrett Henle, MD 09/19/22 2007

## 2022-12-24 ENCOUNTER — Emergency Department (HOSPITAL_COMMUNITY)
Admission: EM | Admit: 2022-12-24 | Discharge: 2022-12-25 | Disposition: A | Discharge status: Home / Self Care | Payer: BLUE CROSS/BLUE SHIELD | Attending: Emergency Medicine | Admitting: Emergency Medicine

## 2022-12-24 ENCOUNTER — Emergency Department (HOSPITAL_COMMUNITY): Payer: BLUE CROSS/BLUE SHIELD

## 2022-12-24 ENCOUNTER — Encounter (HOSPITAL_COMMUNITY): Payer: Self-pay

## 2022-12-24 DIAGNOSIS — R002 Palpitations: Secondary | ICD-10-CM | POA: Insufficient documentation

## 2022-12-24 LAB — MAGNESIUM: Magnesium: 2.2 mg/dL (ref 1.7–2.4)

## 2022-12-24 LAB — BASIC METABOLIC PANEL
Anion gap: 6 (ref 5–15)
BUN: 17 mg/dL (ref 6–20)
CO2: 23 mmol/L (ref 22–32)
Calcium: 8.8 mg/dL — ABNORMAL LOW (ref 8.9–10.3)
Chloride: 110 mmol/L (ref 98–111)
Creatinine, Ser: 1.09 mg/dL (ref 0.61–1.24)
GFR, Estimated: >60 mL/min (ref >60)
Glucose, Bld: 120 mg/dL — ABNORMAL HIGH (ref 70–99)
Potassium: 4.1 mmol/L (ref 3.5–5.1)
Sodium: 139 mmol/L (ref 135–145)

## 2022-12-24 LAB — TROPONIN I (HIGH SENSITIVITY): Troponin I (High Sensitivity): 2 ng/L (ref <18)

## 2022-12-24 LAB — CBC
HCT: 42.1 % (ref 39.0–52.0)
Hemoglobin: 14.1 g/dL (ref 13.0–17.0)
MCH: 29.8 pg (ref 26.0–34.0)
MCHC: 33.5 g/dL (ref 30.0–36.0)
MCV: 89 fL (ref 80.0–100.0)
Platelets: 278 10*3/uL (ref 150–400)
RBC: 4.73 MIL/uL (ref 4.22–5.81)
RDW: 13.1 % (ref 11.5–15.5)
WBC: 4.2 10*3/uL (ref 4.0–10.5)
nRBC: 0 % (ref 0.0–0.2)

## 2022-12-24 LAB — TSH: TSH: 1.276 u[IU]/mL (ref 0.350–4.500)

## 2022-12-24 NOTE — ED Provider Triage Note (Signed)
Emergency Medicine Provider Triage Evaluation Note  Anthony Walker , a 61 y.o. male  was evaluated in triage.  Pt complains of palpitations x12 days. Comes and goes. + cp. Sent from UC.  No syncope, no sob Review of Systems  Positive: palpitations Negative: syncope  Physical Exam  BP (!) 137/92 (BP Location: Left Arm)   Pulse 89   Temp 98 F (36.7 C) (Oral)   Resp 16   Ht 5\' 5"  (1.651 m)   Wt 76.2 kg   SpO2 100%   BMI 27.96 kg/m  Gen:   Awake, no distress   Resp:  Normal effort  MSK:   Moves extremities without difficulty  Other:    Medical Decision Making  Medically screening exam initiated at 5:43 PM.  Appropriate orders placed.  Anthony Walker was informed that the remainder of the evaluation will be completed by another provider, this initial triage assessment does not replace that evaluation, and the importance of remaining in the ED until their evaluation is complete.     , PA-C 12/24/22 1745

## 2022-12-24 NOTE — ED Triage Notes (Signed)
Pt arrived via POV, c/o palpitations intermittently for 2-3 weeks. Seen at [facility] and sent to ED for further eval

## 2022-12-25 LAB — TROPONIN I (HIGH SENSITIVITY): Troponin I (High Sensitivity): <2 ng/L (ref <18)

## 2022-12-25 MED ORDER — LIDOCAINE VISCOUS HCL 2 % MT SOLN
15.0000 mL | Freq: Once | OROMUCOSAL | Status: AC
  Administered 2022-12-25: 15 mL via ORAL
  Filled 2022-12-25: qty 15

## 2022-12-25 MED ORDER — FAMOTIDINE 20 MG PO TABS
20.0000 mg | ORAL_TABLET | ORAL | Status: AC
  Administered 2022-12-25: 20 mg via ORAL
  Filled 2022-12-25: qty 1

## 2022-12-25 MED ORDER — ALUM & MAG HYDROXIDE-SIMETH 200-200-20 MG/5ML PO SUSP
30.0000 mL | Freq: Once | ORAL | Status: AC
  Administered 2022-12-25: 30 mL via ORAL
  Filled 2022-12-25: qty 30

## 2022-12-25 NOTE — Discharge Instructions (Addendum)
You were seen for your palpitations and chest pain in the emergency department.   At home, please continue to take over-the-counter antacids for chest discomfort.  Refrain from heavy alcohol or caffeine use as it may worsen your palpitations.  Follow-up with your primary doctor in 2-3 days regarding your visit.  Please follow-up with cardiology regarding a Holter monitor (outpatient monitor that may be used to monitor for arrhythmias).  We have placed an outpatient referral so they will try to contact you within 72 hours regarding your monitor.  Return immediately to the emergency department if you experience any of the following: Fainting, worsening chest discomfort or pain, or any other concerning symptoms.    Thank you for visiting our Emergency Department. It was a pleasure taking care of you today.

## 2022-12-25 NOTE — ED Provider Notes (Signed)
[facility]-EMERGENCY DEPT Provider Note   CSN: [id number] Arrival date & time: 12/24/22  1510     History  Chief Complaint  Patient presents with   Palpitations    Anthony Walker is a 61 y.o. male.  61 year old male with a history of GERD who presents emergency department with palpitations.  Over the past 2 weeks has had increasing frequency of palpitations that last several minutes at a time.  Says that they have become more frequent recently.  Typically happens at night.  No syncopal episodes.  Says that it does not happen with exercise and was able to play soccer 2 days ago without difficulty.  Also has had over a year of substernal chest discomfort.  Describes it as a burning and worse with eating.  Is currently being treated with antacids but has not had improvement.  This is the pain is not exertional and is not associated with diaphoresis or vomiting.  Went to urgent care yesterday and had an EKG that showed sinus rhythm with PACs at a rate of 88 and was referred into the emergency department for additional evaluation of his symptoms.       Home Medications Prior to Admission medications   Medication Sig Start Date End Date Taking? Authorizing Provider  pantoprazole (PROTONIX) 40 MG tablet Take 1 tablet (40 mg total) by mouth daily. 09/19/22   Zenia Resides, MD  sucralfate (CARAFATE) 1 g tablet Take 1 tablet (1 g total) by mouth 2 (two) times daily. 09/19/22   Zenia Resides, MD      Allergies    Patient has no known allergies.    Review of Systems   Review of Systems  Physical Exam Updated Vital Signs BP 136/81   Pulse 84   Temp 98.4 F (36.9 C) (Oral)   Resp 18   Ht 5\' 5"  (1.651 m)   Wt 76.2 kg   SpO2 100%   BMI 27.96 kg/m  Physical Exam Vitals and nursing note reviewed.  Constitutional:      General: He is not in acute distress.    Appearance: He is well-developed.  HENT:     Head: Normocephalic and atraumatic.      Right Ear: External ear normal.     Left Ear: External ear normal.     Nose: Nose normal.  Eyes:     Extraocular Movements: Extraocular movements intact.     Conjunctiva/sclera: Conjunctivae normal.     Pupils: Pupils are equal, round, and reactive to light.  Cardiovascular:     Rate and Rhythm: Normal rate and regular rhythm.     Comments: No arrhythmias noted on monitor Pulmonary:     Effort: Pulmonary effort is normal. No respiratory distress.  Musculoskeletal:     Cervical back: Normal range of motion and neck supple.  Skin:    General: Skin is warm and dry.  Neurological:     Mental Status: He is alert. Mental status is at baseline.  Psychiatric:        Mood and Affect: Mood normal.        Behavior: Behavior normal.     ED Results / Procedures / Treatments   Labs (all labs ordered are listed, but only abnormal results are displayed) Labs Reviewed  BASIC METABOLIC PANEL - Abnormal; Notable for the following components:      Result Value   Glucose, Bld 120 (*)    Calcium 8.8 (*)    All other components within  normal limits  MAGNESIUM  CBC  TSH  TROPONIN I (HIGH SENSITIVITY)  TROPONIN I (HIGH SENSITIVITY)    EKG EKG Interpretation  Date/Time:  Tuesday December 24 2022 15:32:07 EST Ventricular Rate:  87 PR Interval:  168 QRS Duration: 82 QT Interval:  356 QTC Calculation: 428 R Axis:   68 Text Interpretation: Sinus rhythm with Premature supraventricular complexes Otherwise normal ECG When compared with ECG of 19-Sep-2022 19:12, PREVIOUS ECG IS PRESENT Confirmed by Gerlene Fee [phone]) on 12/25/2022 4:12:06 AM  Radiology No results found.  Procedures Procedures   Medications Ordered in ED Medications  alum & mag hydroxide-simeth (MAALOX/MYLANTA) 200-200-20 MG/5ML suspension 30 mL (30 mLs Oral Given 12/25/22 1444)    And  lidocaine (XYLOCAINE) 2 % viscous mouth solution 15 mL (15 mLs Oral Given 12/25/22 1444)  famotidine (PEPCID) tablet 20 mg (20 mg Oral  Given 12/25/22 1443)    ED Course/ Medical Decision Making/ A&P                           Medical Decision Making Risk OTC drugs. Prescription drug management.   Anthony Walker is a 61 y.o. male with comorbidities that complicate the patient evaluation including GERD who presents to the emergency department with palpitations and chest burning with EKG urgent care showing premature atrial contractions  Initial Ddx:  PACs, paroxysmal arrhythmias, MI, PE, hyperthyroid  MDM:  Feel the patient likely is having premature atrial contractions or a benign paroxysmal arrhythmia.  Based on his EKG at urgent care does not sound like there are any malignant arrhythmias at play but will obtain repeat EKG.  Not having significant shortness of breath that would be concerning for pulmonary embolism.  With his chest burning feel it is likely GERD but will assess with EKG and troponins to ensure that there is no cardiac cause.  Will also check TSH today as it can be a common cause of arrhythmias.  Plan:  Labs Troponin TSH EKG Chest x-ray  ED Summary/Re-evaluation:  Patient assessed and was stable in the emergency department.  Did have occasional runs of sinus tachycardia on telemetry that were spontaneously resolving.  Feel the patient's symptoms may be from atrial tachycardia.  EKG and troponins were reassuring.  Will have him follow-up with his primary care doctor and talk to cardiology about a Holter monitor.  This patient presents to the ED for concern of complaints listed in HPI, this involves an extensive number of treatment options, and is a complaint that carries with it a high risk of complications and morbidity. Disposition including potential need for admission considered.   Dispo: DC Home. Return precautions discussed including, but not limited to, those listed in the AVS. Allowed pt time to ask questions which were answered fully prior to dc.  Additional history obtained from  spouse Records reviewed Outpatient Clinic Notes The following labs were independently interpreted: Chemistry and Serial Troponins and show no acute abnormality I independently reviewed the following imaging with scope of interpretation limited to determining acute life threatening conditions related to emergency care: Chest x-ray and agree with the radiologist interpretation with the following exceptions: None I personally reviewed and interpreted cardiac monitoring: normal sinus rhythm  and sinus tachycardia I personally reviewed and interpreted the pt's EKG: see above for interpretation  I have reviewed the patients home medications and made adjustments as needed  Final Clinical Impression(s) / ED Diagnoses Final diagnoses:  Palpitations    Rx /  DC Orders ED Discharge Orders          Ordered    Ambulatory referral to Cardiology       Comments: Halter monitor, palpitations   12/25/22 1623              Fransico Meadow, MD 12/27/22 [phone]

## 2024-02-09 ENCOUNTER — Encounter (HOSPITAL_BASED_OUTPATIENT_CLINIC_OR_DEPARTMENT_OTHER): Payer: Self-pay | Admitting: Emergency Medicine

## 2024-02-09 ENCOUNTER — Emergency Department (HOSPITAL_BASED_OUTPATIENT_CLINIC_OR_DEPARTMENT_OTHER)
Admission: EM | Admit: 2024-02-09 | Discharge: 2024-02-10 | Disposition: A | Discharge status: Home / Self Care | Payer: BLUE CROSS/BLUE SHIELD | Attending: Emergency Medicine | Admitting: Emergency Medicine

## 2024-02-09 ENCOUNTER — Emergency Department (HOSPITAL_BASED_OUTPATIENT_CLINIC_OR_DEPARTMENT_OTHER): Payer: BLUE CROSS/BLUE SHIELD | Admitting: Radiology

## 2024-02-09 DIAGNOSIS — R61 Generalized hyperhidrosis: Secondary | ICD-10-CM | POA: Insufficient documentation

## 2024-02-09 DIAGNOSIS — R079 Chest pain, unspecified: Secondary | ICD-10-CM | POA: Diagnosis present

## 2024-02-09 DIAGNOSIS — E119 Type 2 diabetes mellitus without complications: Secondary | ICD-10-CM | POA: Diagnosis not present

## 2024-02-09 LAB — CBC
HCT: 44.2 % (ref 39.0–52.0)
Hemoglobin: 15.1 g/dL (ref 13.0–17.0)
MCH: 29.9 pg (ref 26.0–34.0)
MCHC: 34.2 g/dL (ref 30.0–36.0)
MCV: 87.5 fL (ref 80.0–100.0)
Platelets: 241 10*3/uL (ref 150–400)
RBC: 5.05 MIL/uL (ref 4.22–5.81)
RDW: 13.4 % (ref 11.5–15.5)
WBC: 5.2 10*3/uL (ref 4.0–10.5)
nRBC: 0 % (ref 0.0–0.2)

## 2024-02-09 LAB — BASIC METABOLIC PANEL
Anion gap: 6 (ref 5–15)
BUN: 10 mg/dL (ref 6–20)
CO2: 18 mmol/L — ABNORMAL LOW (ref 22–32)
Calcium: 7.2 mg/dL — ABNORMAL LOW (ref 8.9–10.3)
Chloride: 115 mmol/L — ABNORMAL HIGH (ref 98–111)
Creatinine, Ser: 0.79 mg/dL (ref 0.61–1.24)
GFR, Estimated: >60 mL/min (ref >60)
Glucose, Bld: 94 mg/dL (ref 70–99)
Potassium: 5.4 mmol/L — ABNORMAL HIGH (ref 3.5–5.1)
Sodium: 139 mmol/L (ref 135–145)

## 2024-02-09 LAB — TROPONIN I (HIGH SENSITIVITY)
Troponin I (High Sensitivity): <2 ng/L (ref <18)
Troponin I (High Sensitivity): 2 ng/L (ref <18)

## 2024-02-09 NOTE — ED Triage Notes (Signed)
 Chest pain mid to the right x 3 days, intermittant No sob, no n/v Some burning, some sharp, some soreness  No relief with pantaprozole

## 2024-02-10 ENCOUNTER — Other Ambulatory Visit: Payer: Self-pay

## 2024-02-10 LAB — D-DIMER, QUANTITATIVE: D-Dimer, Quant: <0.27 ug{FEU}/mL (ref 0.00–0.50)

## 2024-02-10 NOTE — ED Notes (Signed)
ED Provider at bedside.

## 2024-02-10 NOTE — ED Provider Notes (Signed)
[place] EMERGENCY DEPARTMENT AT [facility] Provider Note   CSN: [id number] Arrival date & time: 02/09/24  1911     History  Chief Complaint  Patient presents with   Chest Pain    Anthony Walker is a 63 y.o. male.  The history is provided by the patient.  Chest Pain He has history of diabetes and comes in because of episodic chest pain for the last 3-4 days.  Pain is dull and is over the central and left side of the chest.  Will last for about 10 minutes before resolving.  There is no associated dyspnea or nausea.  He has had occasional episodes of diaphoresis.  Pain is not worse with deep breath or with movement or change in body position.  He has a history of acid reflux and has been taking omeprazole, but it does not seem to be helping.  He is a non-smoker and denies history of hypertension, hyperlipidemia and there is no family history of premature coronary atherosclerosis.  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   Home Medications Prior to Admission medications   Medication Sig Start Date End Date Taking? Authorizing Provider  pantoprazole (PROTONIX) 40 MG tablet Take 1 tablet (40 mg total) by mouth daily. 09/19/22   Zenia Resides, MD  sucralfate (CARAFATE) 1 g tablet Take 1 tablet (1 g total) by mouth 2 (two) times daily. 09/19/22   Zenia Resides, MD      Allergies    Patient has no known allergies.    Review of Systems   Review of Systems  Cardiovascular:  Positive for chest pain.  All other systems reviewed and are negative.   Physical Exam Updated Vital Signs BP 128/88   Pulse 62   Temp 98.2 F (36.8 C) (Oral)   Resp 18   SpO2 100%  Physical Exam Vitals and nursing note reviewed.   63 year old male, resting comfortably and in no acute distress. Vital signs are normal. Oxygen saturation is 100%, which is normal. Head is normocephalic and atraumatic. PERRLA, EOMI. Oropharynx is clear. Neck is  nontender and supple without adenopathy. Lungs are clear without rales, wheezes, or rhonchi. Chest is nontender. Heart has regular rate and rhythm without murmur. Abdomen is soft, flat, nontender. Extremities have no cyanosis or edema, full range of motion is present. Skin is warm and dry without rash. Neurologic: Mental status is normal, cranial nerves are intact, moves all extremities equally.  ED Results / Procedures / Treatments   Labs (all labs ordered are listed, but only abnormal results are displayed) Labs Reviewed  BASIC METABOLIC PANEL - Abnormal; Notable for the following components:      Result Value   Potassium 5.4 (*)    Chloride 115 (*)    CO2 18 (*)    Calcium 7.2 (*)    All other components within normal limits  CBC  D-DIMER, QUANTITATIVE  TROPONIN I (HIGH SENSITIVITY)  TROPONIN I (HIGH SENSITIVITY)    EKG EKG Interpretation Date/Time:  Monday February 09 2024 19:33:42 EST Ventricular Rate:  68 PR Interval:  186 QRS Duration:  86 QT Interval:  372 QTC Calculation: 395 R Axis:   55  Text Interpretation: Normal sinus rhythm Normal ECG When compared with ECG of 24-Dec-2022 15:32, Premature supraventricular complexes are no longer Present Confirmed by Dione Booze ([id number]) on 02/10/2024 12:20:24 AM  Radiology DG Chest 2 View Result Date: 02/09/2024 CLINICAL DATA:  Chest pain x3 days. EXAM: CHEST - 2 VIEW COMPARISON:  December 24, 2022 FINDINGS: The heart size and mediastinal contours are within normal limits. There is mild to moderate severity calcification of the aortic arch. Both lungs are clear. The visualized skeletal structures are unremarkable. IMPRESSION: No active cardiopulmonary disease. Electronically Signed   By: Aram Candela M.D.   On: 02/09/2024 20:38    Procedures Procedures    Medications Ordered in ED Medications - No data to display  ED Course/ Medical Decision Making/ A&P             HEART Score: 2                    Medical  Decision Making Amount and/or Complexity of Data Reviewed Labs: ordered. Radiology: ordered.   Chest pain which seems very atypical.  Differential diagnosis does include angina, GERD, musculoskeletal pain, pulmonary embolism.  I have reviewed his electrocardiogram, my interpretation is normal ECG.  I have reviewed his laboratory tests, and my interpretation is normal troponin x 2, normal CBC, unremarkable basic metabolic panel.  Chest x-ray shows no active cardiopulmonary disease.  I have independently viewed the images, and agree with radiologist's interpretation.  Heart score is 2, which puts him at low risk for major adverse cardiac events in the next 6 weeks.  I have ordered a D-dimer to rule out pulmonary embolism since he does have a risk factor for developing PE.  I have reviewed his past records, and he had a cardiology consult on 01/07/2023 for palpitations, had normal TEE on 03/11/2023.  D-dimer is normal, pulmonary embolism is ruled out.  I am discharging the patient with instructions to follow-up with his cardiologist.  I am recommending that he continue taking his proton pump inhibitor for the next 10 days.  Return for worsening symptoms.  Final Clinical Impression(s) / ED Diagnoses Final diagnoses:  Nonspecific chest pain    Rx / DC Orders ED Discharge Orders     None         Dione Booze, MD 02/10/24 [phone]

## 2024-02-10 NOTE — Discharge Instructions (Addendum)
Your evaluation did not show any serious cause for your chest pain.  Please continue taking omeprazole for the next 10 days.  If your pain is not improving, you can stop taking omeprazole at that point.  Please follow-up with your cardiologist for further outpatient evaluation.  Return to the emergency department if your symptoms are worsening.
